# Patient Record
Sex: Female | Born: 1980 | Race: Black or African American | Hispanic: No | Smoking: Never smoker
Health system: Southern US, Community
[De-identification: ages and names within clinical notes are randomized; demographics above are authoritative.]

## PROBLEM LIST (undated history)

## (undated) ENCOUNTER — Inpatient Hospital Stay (HOSPITAL_COMMUNITY): Payer: Self-pay

## (undated) DIAGNOSIS — R42 Dizziness and giddiness: Secondary | ICD-10-CM

## (undated) DIAGNOSIS — Z789 Other specified health status: Secondary | ICD-10-CM

## (undated) HISTORY — PX: TONSILLECTOMY: SUR1361

## (undated) HISTORY — PX: WISDOM TOOTH EXTRACTION: SHX21

---

## 2011-06-19 ENCOUNTER — Emergency Department (INDEPENDENT_AMBULATORY_CARE_PROVIDER_SITE_OTHER)

## 2011-06-19 ENCOUNTER — Emergency Department (INDEPENDENT_AMBULATORY_CARE_PROVIDER_SITE_OTHER)
Admission: EM | Admit: 2011-06-19 | Discharge: 2011-06-19 | Disposition: A | Discharge status: Home / Self Care | Source: Home / Self Care | Attending: Family Medicine | Admitting: Family Medicine

## 2011-06-19 ENCOUNTER — Encounter (HOSPITAL_COMMUNITY): Payer: Self-pay | Admitting: Emergency Medicine

## 2011-06-19 ENCOUNTER — Ambulatory Visit: Payer: Self-pay

## 2011-06-19 DIAGNOSIS — S93409A Sprain of unspecified ligament of unspecified ankle, initial encounter: Secondary | ICD-10-CM

## 2011-06-19 DIAGNOSIS — IMO0002 Reserved for concepts with insufficient information to code with codable children: Secondary | ICD-10-CM

## 2011-06-19 MED ORDER — HYDROCODONE-ACETAMINOPHEN 5-325 MG PO TABS: 1.0000 | ORAL_TABLET | Freq: Four times a day (QID) | ORAL | Status: AC | PRN

## 2011-06-19 NOTE — ED Notes (Signed)
Pt here with left medial ankle pain and swelling after injury today.states she missed step and ankle twisted and popped.swelling seen and limping.ice and ibuprofen taken

## 2011-06-19 NOTE — ED Provider Notes (Signed)
History     CSN: [id number]  Arrival date & time 06/19/11  1340   First MD Initiated Contact with Patient 06/19/11 1342      Chief Complaint  Patient presents with  . Ankle Injury    (Consider location/radiation/quality/duration/timing/severity/associated sxs/prior treatment) Patient is a 31 y.o. female presenting with ankle pain. The history is provided by the patient.  Ankle Pain  The incident occurred 3 to 5 hours ago. The incident occurred at work. Injury mechanism: missed last step coming down and rolled foot/ankle on left with pop heard, pain with walking. The pain is present in the left ankle. The pain is mild. Associated symptoms include inability to bear weight. Pertinent negatives include no numbness and no tingling.    History reviewed. No pertinent past medical history.  Past Surgical History  Procedure Date  . Tonsillectomy     No family history on file.  History  Substance Use Topics  . Smoking status: Never Smoker   . Smokeless tobacco: Not on file  . Alcohol Use: Yes    OB History    Grav Para Term Preterm Abortions TAB SAB Ect Mult Living                  Review of Systems  Constitutional: Negative.   Musculoskeletal: Positive for joint swelling and gait problem.  Neurological: Negative for tingling and numbness.    Allergies  Review of patient's allergies indicates no known allergies.  Home Medications   Current Outpatient Rx  Name Route Sig Dispense Refill  . HYDROCODONE-ACETAMINOPHEN 5-325 MG PO TABS Oral Take 1 tablet by mouth every 6 (six) hours as needed for pain. 10 tablet 1    BP 134/67  Pulse 70  Temp 98.3 F (36.8 C) (Oral)  Resp 14  SpO2 99%  LMP 05/19/2011  Physical Exam  Nursing note and vitals reviewed. Constitutional: She is oriented to person, place, and time. She appears well-developed and well-nourished.  Musculoskeletal: She exhibits tenderness.       Left ankle: She exhibits swelling. She exhibits no deformity,  no laceration and normal pulse. tenderness. AITFL tenderness found. No lateral malleolus, no medial malleolus, no CF ligament, no posterior TFL, no head of 5th metatarsal and no proximal fibula tenderness found. Achilles tendon normal.  Neurological: She is alert and oriented to person, place, and time.  Skin: Skin is warm and dry.    ED Course  Procedures (including critical care time)  Labs Reviewed - No data to display Dg Ankle Complete Left  06/19/2011  *RADIOLOGY REPORT*  Clinical Data: Twisting injury of ankle.  LEFT ANKLE COMPLETE - 3+ VIEW  Comparison: None.  Findings: There are small avulsion fractures from the dorsal aspect of the navicular, best seen on the lateral view. There are possible additional small avulsion fractures laterally on the AP view, possibly from the calcaneus or cuboid.  There is mild lateral soft tissue swelling.  The distal tibia and fibula appear intact.  IMPRESSION: Small avulsion fractures as described with lateral midfoot soft tissue swelling.  Original Report Authenticated By: Gerrianne Scale, M.D.     1. Sprain of left ankle or foot       MDM  X-rays reviewed and report per radiologist.         Linna Hoff, MD 06/19/11 1500

## 2011-06-19 NOTE — Discharge Instructions (Signed)
Wear ankle support as needed for comfort, activity as tolerated. advil or pain medicine as needed, ice and crutches and elevate for pain and swelling,return or see orthopedist if further problems.

## 2013-01-05 NOTE — L&D Delivery Note (Signed)
Delivery Note At 9:21 AM a viable and healthy female was delivered via Vaginal, Spontaneous Delivery (Presentation: Right Occiput Anterior).  APGAR: 9, 9; weight  pending.   Placenta status: Intact, Spontaneous.  True knot x 1 Cord: 3 vessels with the following complications: Knot.  Cord pH: none  Anesthesia: Epidural Local  Episiotomy: None Lacerations: right  Labial minora; left Periurethral Suture Repair: 3.0 chromic Est. Blood Loss (mL): 350  Mom to postpartum.  Baby to Couplet care / Skin to Skin.  Sebron Mcmahill A 10/29/2013, 10:06 AM

## 2013-04-14 LAB — OB RESULTS CONSOLE RUBELLA ANTIBODY, IGM
Rubella: IMMUNE
Rubella: NON-IMMUNE/NOT IMMUNE

## 2013-04-14 LAB — OB RESULTS CONSOLE HIV ANTIBODY (ROUTINE TESTING): HIV: NONREACTIVE

## 2013-04-14 LAB — OB RESULTS CONSOLE ABO/RH: RH Type: POSITIVE

## 2013-04-14 LAB — OB RESULTS CONSOLE HEPATITIS B SURFACE ANTIGEN: Hepatitis B Surface Ag: NEGATIVE

## 2013-04-14 LAB — OB RESULTS CONSOLE RPR: RPR: NONREACTIVE

## 2013-04-24 LAB — OB RESULTS CONSOLE GC/CHLAMYDIA
CHLAMYDIA, DNA PROBE: NEGATIVE
GC PROBE AMP, GENITAL: NEGATIVE

## 2013-10-13 LAB — OB RESULTS CONSOLE GBS: STREP GROUP B AG: POSITIVE

## 2013-10-28 ENCOUNTER — Encounter (HOSPITAL_COMMUNITY): Payer: Self-pay | Admitting: *Deleted

## 2013-10-28 ENCOUNTER — Inpatient Hospital Stay (HOSPITAL_COMMUNITY)
Admission: AD | Admit: 2013-10-28 | Discharge: 2013-10-31 | DRG: 775 | Disposition: A | Discharge status: Home / Self Care | Source: Ambulatory Visit | Attending: Obstetrics and Gynecology | Admitting: Obstetrics and Gynecology

## 2013-10-28 ENCOUNTER — Inpatient Hospital Stay (HOSPITAL_COMMUNITY): Admitting: Anesthesiology

## 2013-10-28 ENCOUNTER — Encounter (HOSPITAL_COMMUNITY): Admitting: Anesthesiology

## 2013-10-28 DIAGNOSIS — O9902 Anemia complicating childbirth: Secondary | ICD-10-CM | POA: Diagnosis present

## 2013-10-28 DIAGNOSIS — Z833 Family history of diabetes mellitus: Secondary | ICD-10-CM | POA: Diagnosis not present

## 2013-10-28 DIAGNOSIS — Z3A38 38 weeks gestation of pregnancy: Secondary | ICD-10-CM | POA: Diagnosis present

## 2013-10-28 DIAGNOSIS — D62 Acute posthemorrhagic anemia: Secondary | ICD-10-CM | POA: Diagnosis not present

## 2013-10-28 DIAGNOSIS — O99824 Streptococcus B carrier state complicating childbirth: Secondary | ICD-10-CM | POA: Diagnosis present

## 2013-10-28 DIAGNOSIS — D509 Iron deficiency anemia, unspecified: Secondary | ICD-10-CM | POA: Diagnosis present

## 2013-10-28 DIAGNOSIS — O9989 Other specified diseases and conditions complicating pregnancy, childbirth and the puerperium: Secondary | ICD-10-CM | POA: Diagnosis present

## 2013-10-28 HISTORY — DX: Other specified health status: Z78.9

## 2013-10-28 LAB — CBC
HEMATOCRIT: 31.9 % — AB (ref 36.0–46.0)
HEMOGLOBIN: 10.7 g/dL — AB (ref 12.0–15.0)
MCH: 26.8 pg (ref 26.0–34.0)
MCHC: 33.5 g/dL (ref 30.0–36.0)
MCV: 79.9 fL (ref 78.0–100.0)
Platelets: 171 10*3/uL (ref 150–400)
RBC: 3.99 MIL/uL (ref 3.87–5.11)
RDW: 12.9 % (ref 11.5–15.5)
WBC: 7.1 10*3/uL (ref 4.0–10.5)

## 2013-10-28 LAB — TYPE AND SCREEN
ABO/RH(D): O POS
Antibody Screen: NEGATIVE

## 2013-10-28 LAB — ABO/RH: ABO/RH(D): O POS

## 2013-10-28 LAB — RPR

## 2013-10-28 MED ORDER — TERBUTALINE SULFATE 1 MG/ML IJ SOLN: 0.2500 mg | Freq: Once | INTRAMUSCULAR | Status: AC | PRN

## 2013-10-28 MED ORDER — PHENYLEPHRINE 40 MCG/ML (10ML) SYRINGE FOR IV PUSH (FOR BLOOD PRESSURE SUPPORT)
80.0000 ug | PREFILLED_SYRINGE | INTRAVENOUS | Status: DC | PRN
  Filled 2013-10-28: qty 2

## 2013-10-28 MED ORDER — MISOPROSTOL 50MCG HALF TABLET
50.0000 ug | ORAL_TABLET | Freq: Four times a day (QID) | ORAL | Status: DC
  Administered 2013-10-28 (×2): 50 ug via ORAL
  Filled 2013-10-28 (×6): qty 1

## 2013-10-28 MED ORDER — FLEET ENEMA 7-19 GM/118ML RE ENEM: 1.0000 | ENEMA | Freq: Every day | RECTAL | Status: DC | PRN

## 2013-10-28 MED ORDER — OXYCODONE-ACETAMINOPHEN 5-325 MG PO TABS: 2.0000 | ORAL_TABLET | ORAL | Status: DC | PRN

## 2013-10-28 MED ORDER — NALBUPHINE HCL 10 MG/ML IJ SOLN
10.0000 mg | INTRAMUSCULAR | Status: DC | PRN
Start: 2013-10-28 — End: 2013-10-29
  Administered 2013-10-28: 10 mg via INTRAMUSCULAR
  Filled 2013-10-28: qty 1

## 2013-10-28 MED ORDER — OXYTOCIN 40 UNITS IN LACTATED RINGERS INFUSION - SIMPLE MED: 62.5000 mL/h | INTRAVENOUS | Status: DC

## 2013-10-28 MED ORDER — ONDANSETRON HCL 4 MG/2ML IJ SOLN: 4.0000 mg | Freq: Four times a day (QID) | INTRAMUSCULAR | Status: DC | PRN

## 2013-10-28 MED ORDER — PENICILLIN G POTASSIUM 5000000 UNITS IJ SOLR
2.5000 10*6.[IU] | INTRAVENOUS | Status: DC
  Administered 2013-10-28 – 2013-10-29 (×6): 2.5 10*6.[IU] via INTRAVENOUS
  Filled 2013-10-28 (×9): qty 2.5

## 2013-10-28 MED ORDER — OXYCODONE-ACETAMINOPHEN 5-325 MG PO TABS: 1.0000 | ORAL_TABLET | ORAL | Status: DC | PRN

## 2013-10-28 MED ORDER — DIPHENHYDRAMINE HCL 50 MG/ML IJ SOLN: 12.5000 mg | INTRAMUSCULAR | Status: DC | PRN

## 2013-10-28 MED ORDER — FENTANYL 2.5 MCG/ML BUPIVACAINE 1/10 % EPIDURAL INFUSION (WH - ANES)
14.0000 mL/h | INTRAMUSCULAR | Status: DC | PRN
  Administered 2013-10-28 – 2013-10-29 (×2): 14 mL/h via EPIDURAL
  Filled 2013-10-28 (×2): qty 125

## 2013-10-28 MED ORDER — LACTATED RINGERS IV SOLN
INTRAVENOUS | Status: DC
  Administered 2013-10-28 (×2): via INTRAVENOUS

## 2013-10-28 MED ORDER — OXYTOCIN BOLUS FROM INFUSION: 500.0000 mL | INTRAVENOUS | Status: DC

## 2013-10-28 MED ORDER — NALBUPHINE HCL 10 MG/ML IJ SOLN
10.0000 mg | INTRAMUSCULAR | Status: DC | PRN
  Administered 2013-10-28 (×2): 10 mg via INTRAVENOUS
  Filled 2013-10-28 (×2): qty 1

## 2013-10-28 MED ORDER — PENICILLIN G POTASSIUM 5000000 UNITS IJ SOLR
5.0000 10*6.[IU] | Freq: Once | INTRAVENOUS | Status: AC
  Administered 2013-10-28: 5 10*6.[IU] via INTRAVENOUS
  Filled 2013-10-28: qty 5

## 2013-10-28 MED ORDER — LACTATED RINGERS IV SOLN
500.0000 mL | Freq: Once | INTRAVENOUS | Status: AC
Start: 2013-10-28 — End: 2013-10-28
  Administered 2013-10-28: 500 mL via INTRAVENOUS

## 2013-10-28 MED ORDER — ACETAMINOPHEN 325 MG PO TABS: 650.0000 mg | ORAL_TABLET | ORAL | Status: DC | PRN

## 2013-10-28 MED ORDER — LIDOCAINE HCL (PF) 1 % IJ SOLN
30.0000 mL | INTRAMUSCULAR | Status: AC | PRN
  Administered 2013-10-28 (×2): 5 mL via SUBCUTANEOUS

## 2013-10-28 MED ORDER — EPHEDRINE 5 MG/ML INJ
10.0000 mg | INTRAVENOUS | Status: DC | PRN
  Filled 2013-10-28: qty 2

## 2013-10-28 MED ORDER — PHENYLEPHRINE 40 MCG/ML (10ML) SYRINGE FOR IV PUSH (FOR BLOOD PRESSURE SUPPORT)
80.0000 ug | PREFILLED_SYRINGE | INTRAVENOUS | Status: DC | PRN
  Filled 2013-10-28: qty 2
  Filled 2013-10-28: qty 10

## 2013-10-28 MED ORDER — OXYTOCIN 40 UNITS IN LACTATED RINGERS INFUSION - SIMPLE MED
1.0000 m[IU]/min | INTRAVENOUS | Status: DC
  Administered 2013-10-28: 2 m[IU]/min via INTRAVENOUS
  Filled 2013-10-28: qty 1000

## 2013-10-28 MED ORDER — PROMETHAZINE HCL 25 MG/ML IJ SOLN
12.5000 mg | INTRAMUSCULAR | Status: DC | PRN
  Administered 2013-10-28: 12.5 mg via INTRAMUSCULAR
  Filled 2013-10-28: qty 1

## 2013-10-28 MED ORDER — EPHEDRINE 5 MG/ML INJ
10.0000 mg | INTRAVENOUS | Status: DC | PRN
  Filled 2013-10-28: qty 4
  Filled 2013-10-28: qty 2

## 2013-10-28 MED ORDER — LACTATED RINGERS IV SOLN
500.0000 mL | INTRAVENOUS | Status: DC | PRN
  Administered 2013-10-29: 600 mL via INTRAVENOUS

## 2013-10-28 MED ORDER — CITRIC ACID-SODIUM CITRATE 334-500 MG/5ML PO SOLN: 30.0000 mL | ORAL | Status: DC | PRN

## 2013-10-28 NOTE — H&P (Addendum)
Rachael Warren is a 33 y.o. female presenting for admission due to SROM @ 1:30 am clear fluid. Rare ctx. (+) GBS.   Maternal Medical History:  Reason for admission: Rupture of membranes.   Contractions: Frequency: rare.    Fetal activity: Perceived fetal activity is normal.    Prenatal complications: no prenatal complications   OB History   Grav Para Term Preterm Abortions TAB SAB Ect Mult Living   2 0 0 0 1 1    0     Past Medical History  Diagnosis Date  . Medical history non-contributory    Past Surgical History  Procedure Laterality Date  . Tonsillectomy    . Tonsillectomy  age 72   Family History: family history includes Diabetes in her paternal grandmother; Heart disease in her paternal grandfather. Social History:  reports that she has never smoked. She does not have any smokeless tobacco history on file. She reports that she drinks alcohol. She reports that she does not use illicit drugs.   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 rate 18, height _0  (1.651 m), weight 93.441 kg (206 lb). Maternal Exam:  Abdomen: Patient reports no abdominal tenderness. Estimated fetal weight is 7lb 11 oz.   Fetal presentation: vertex  Introitus: Normal vulva. Normal vagina.  Amniotic fluid character: clear.  Pelvis: adequate for delivery.   Cervix: Cervix evaluated by digital exam.     Physical Exam  Constitutional: She appears well-developed and  well-nourished.  HENT:  Head: Atraumatic.  Eyes: EOM are normal.  Neck: Neck supple.  Cardiovascular: Normal rate and regular rhythm.   Respiratory: Effort normal.  GI: Soft.  Musculoskeletal: She exhibits edema.  Skin: Skin is warm and dry.  Psychiatric: She has a normal mood and affect.    Prenatal labs: ABO, Rh: --/--/O POS (10/24 0545) Antibody: NEG (10/24 0545) Rubella: Nonimmune (04/10 0000) RPR: Nonreactive (04/10 0000)  HBsAg: Negative (04/10 0000)  HIV: Non-reactive (04/10 0000)  GBS: Positive (10/09 0000)  CBC    Component Value Date/Time   WBC 7.1 10/28/2013 0545   RBC 3.99 10/28/2013 0545   HGB 10.7* 10/28/2013 0545   HCT 31.9* 10/28/2013 0545   PLT 171 10/28/2013 0545   MCV 79.9 10/28/2013 0545   MCH 26.8 10/28/2013 0545   MCHC 33.5 10/28/2013 0545   RDW 12.9 10/28/2013 0545     Assessment/Plan: SROM IUP@ 38 2/7 weeks GBS cx (+) P) admit routine labs  Oral cytotec 50 mg po q 6 hrs as needed. IV PCN. Defer exam unless need to assess fetus or for pain mgmt analgesic prn   Jilliane Kazanjian A 10/28/2013, 8:26 AM    10/30/13:  Reviewed office records: pt is rubella Immune. Will not need MMR

## 2013-10-28 NOTE — Anesthesia Procedure Notes (Signed)
Epidural Patient location during procedure: OB Start time: 10/28/2013 10:23 PM End time: 10/28/2013 10:37 PM  Staffing Anesthesiologist: Glema Takaki, CHRIS Performed by: anesthesiologist   Preanesthetic Checklist Completed: patient identified, surgical consent, pre-op evaluation, timeout performed, IV checked, risks and benefits discussed and monitors and equipment checked  Epidural Patient position: sitting Prep: site prepped and draped and DuraPrep Patient monitoring: heart rate, cardiac monitor, continuous pulse ox and blood pressure Approach: midline Location: L3-L4 Injection technique: LOR saline  Needle:  Needle type: Tuohy  Needle gauge: 17 G Needle length: 9 cm Needle insertion depth: 7 cm Catheter type: closed end flexible Catheter size: 19 Gauge Catheter at skin depth: 14 cm Test dose: Other and negative  Assessment Events: blood not aspirated, injection not painful, no injection resistance, negative IV test and no paresthesia  Additional Notes H+P and labs checked, risks and benefits discussed with the patient, consent obtained, procedure tolerated well and without complications.  Reason for block:procedure for pain

## 2013-10-28 NOTE — MAU Note (Signed)
Leaking fld since 0130. Clear fld. No contractions.

## 2013-10-28 NOTE — Progress Notes (Signed)
S: notes more painful ctx cytotec #2 given @ 3:30 pm S/p IV nubain  O: tracing: baseline 145 (+)early decel Ctx q 2-3 mins VE 1.5/100/-2 post  IMP: SROM GBS cx (+) on PCN IUP @ 38 2/7 weeks P) cont present mgmt. Pain med.

## 2013-10-28 NOTE — Progress Notes (Signed)
S: s/p cytotec x1 Notes mod cramping  O: VE deferred Tracing baseline 145 Small accel Irritability  IMP: SROM x 12 hr  GBS cx (+) on IV PCN Term gestation P) cont cytotec

## 2013-10-28 NOTE — Anesthesia Preprocedure Evaluation (Signed)
Anesthesia Evaluation   Patient awake    Reviewed: Allergy & Precautions, H&P , NPO status , Patient's Chart, lab work & pertinent test results  History of Anesthesia Complications Negative for: history of anesthetic complications  Airway Mallampati: II TM Distance: >3 FB Neck ROM: Full    Dental  (+) Teeth Intact   Pulmonary neg pulmonary ROS,          Cardiovascular Rhythm:Regular     Neuro/Psych negative neurological ROS  negative psych ROS   GI/Hepatic negative GI ROS, Neg liver ROS,   Endo/Other  negative endocrine ROS  Renal/GU negative Renal ROS     Musculoskeletal   Abdominal   Peds  Hematology  (+) anemia ,   Anesthesia Other Findings   Reproductive/Obstetrics (+) Pregnancy                           Anesthesia Physical Anesthesia Plan  ASA: II  Anesthesia Plan: Epidural   Post-op Pain Management:    Induction:   Airway Management Planned:   Additional Equipment:   Intra-op Plan:   Post-operative Plan:   Informed Consent: I have reviewed the patients History and Physical, chart, labs and discussed the procedure including the risks, benefits and alternatives for the proposed anesthesia with the patient or authorized representative who has indicated his/her understanding and acceptance.     Plan Discussed with: Anesthesiologist  Anesthesia Plan Comments:         Anesthesia Quick Evaluation

## 2013-10-28 NOTE — Progress Notes (Signed)
Report called to CSX CorporationDana RN in Bs. Pt to BS from Triage for further evaluation of SROM and labor. Pad saturated with clear fld in Triage

## 2013-10-29 ENCOUNTER — Encounter (HOSPITAL_COMMUNITY): Payer: Self-pay | Admitting: Obstetrics and Gynecology

## 2013-10-29 MED ORDER — WITCH HAZEL-GLYCERIN EX PADS: 1.0000 "application " | MEDICATED_PAD | CUTANEOUS | Status: DC | PRN

## 2013-10-29 MED ORDER — ONDANSETRON HCL 4 MG/2ML IJ SOLN: 4.0000 mg | INTRAMUSCULAR | Status: DC | PRN

## 2013-10-29 MED ORDER — OXYCODONE-ACETAMINOPHEN 5-325 MG PO TABS: 2.0000 | ORAL_TABLET | ORAL | Status: DC | PRN

## 2013-10-29 MED ORDER — LIDOCAINE HCL (PF) 1 % IJ SOLN
INTRAMUSCULAR | Status: AC
  Filled 2013-10-29: qty 30

## 2013-10-29 MED ORDER — ONDANSETRON HCL 4 MG PO TABS: 4.0000 mg | ORAL_TABLET | ORAL | Status: DC | PRN

## 2013-10-29 MED ORDER — DIBUCAINE 1 % RE OINT: 1.0000 "application " | TOPICAL_OINTMENT | RECTAL | Status: DC | PRN

## 2013-10-29 MED ORDER — PRENATAL MULTIVITAMIN CH
1.0000 | ORAL_TABLET | Freq: Every day | ORAL | Status: DC
  Administered 2013-10-30 – 2013-10-31 (×2): 1 via ORAL
  Filled 2013-10-29 (×2): qty 1

## 2013-10-29 MED ORDER — ZOLPIDEM TARTRATE 5 MG PO TABS: 5.0000 mg | ORAL_TABLET | Freq: Every evening | ORAL | Status: DC | PRN

## 2013-10-29 MED ORDER — BENZOCAINE-MENTHOL 20-0.5 % EX AERO
1.0000 "application " | INHALATION_SPRAY | CUTANEOUS | Status: DC | PRN
  Administered 2013-10-31: 1 via TOPICAL
  Filled 2013-10-29: qty 56

## 2013-10-29 MED ORDER — OXYCODONE-ACETAMINOPHEN 5-325 MG PO TABS
1.0000 | ORAL_TABLET | ORAL | Status: DC | PRN
  Administered 2013-10-29 – 2013-10-31 (×4): 1 via ORAL
  Filled 2013-10-29 (×4): qty 1

## 2013-10-29 MED ORDER — DIPHENHYDRAMINE HCL 25 MG PO CAPS: 25.0000 mg | ORAL_CAPSULE | Freq: Four times a day (QID) | ORAL | Status: DC | PRN

## 2013-10-29 MED ORDER — INFLUENZA VAC SPLIT QUAD 0.5 ML IM SUSY
0.5000 mL | PREFILLED_SYRINGE | INTRAMUSCULAR | Status: AC
  Administered 2013-10-31: 0.5 mL via INTRAMUSCULAR
  Filled 2013-10-29: qty 0.5

## 2013-10-29 MED ORDER — IBUPROFEN 600 MG PO TABS
600.0000 mg | ORAL_TABLET | Freq: Four times a day (QID) | ORAL | Status: DC
  Administered 2013-10-29 – 2013-10-31 (×8): 600 mg via ORAL
  Filled 2013-10-29 (×8): qty 1

## 2013-10-29 MED ORDER — SENNOSIDES-DOCUSATE SODIUM 8.6-50 MG PO TABS
2.0000 | ORAL_TABLET | ORAL | Status: DC
  Administered 2013-10-30 (×2): 2 via ORAL
  Filled 2013-10-29 (×2): qty 2

## 2013-10-29 MED ORDER — FERROUS SULFATE 325 (65 FE) MG PO TABS
325.0000 mg | ORAL_TABLET | Freq: Two times a day (BID) | ORAL | Status: DC
  Administered 2013-10-29 – 2013-10-31 (×4): 325 mg via ORAL
  Filled 2013-10-29 (×4): qty 1

## 2013-10-29 MED ORDER — LIDOCAINE HCL (PF) 1 % IJ SOLN
30.0000 mL | INTRAMUSCULAR | Status: DC | PRN
  Administered 2013-10-29: 30 mL via SUBCUTANEOUS
  Filled 2013-10-29: qty 30

## 2013-10-29 MED ORDER — SIMETHICONE 80 MG PO CHEW: 80.0000 mg | CHEWABLE_TABLET | ORAL | Status: DC | PRN

## 2013-10-29 MED ORDER — LANOLIN HYDROUS EX OINT: TOPICAL_OINTMENT | CUTANEOUS | Status: DC | PRN

## 2013-10-29 NOTE — Anesthesia Postprocedure Evaluation (Deleted)
  Anesthesia Post-op Note  Patient: Rachael BjorkAnn L Parkerson  Procedure(s) Performed: * No procedures listed *  Patient Location: Mother/Baby  Anesthesia Type:Epidural  Level of Consciousness: awake and alert   Airway and Oxygen Therapy: Patient Spontanous Breathing  Post-op Pain: mild  Post-op Assessment: Post-op Vital signs reviewed, Patient's Cardiovascular Status Stable, Respiratory Function Stable, No signs of Nausea or vomiting, Pain level controlled, No headache, No residual numbness and No residual motor weakness  Post-op Vital Signs: Reviewed  Last Vitals:  Filed Vitals:   10/29/13 1650  BP: 107/68  Pulse: 84  Temp: 37 C  Resp: 18    Complications: No apparent anesthesia complications

## 2013-10-29 NOTE — Lactation Note (Signed)
This note was copied from the chart of Boy Ulysees Barnsnn Bacote. Lactation Consultation Note  Patient Name: Boy Ulysees Barnsnn [place] ZOXWR'UToday's Date: 10/29/2013 Reason for consult: Initial assessment of this mom and baby, now 10 hours postpartum.  Mom is a primipara who attended [facility] BF class prenatally.  She reports that baby latched easily after delivery and has had a few feedings, with initial LATCH score=7.  Per mom, she has been shown hand expression technique.  LC encouraged frequent STS and cue feedings.  Mom encouraged to feed baby 8-12 times/24 hours and with feeding cues. LC encouraged review of Baby and Me pp 9, 14 and 20-25 for STS and BF information. LC provided [employer] Resource brochure and reviewed [facility] services and list of community and web site resources.      Maternal Data Formula Feeding for Exclusion: No Has patient been taught Hand Expression?: Yes (attended class and states her nurse has shown her how to hand express) Does the patient have breastfeeding experience prior to this delivery?: No  Feeding Feeding Type: Breast Fed Length of feed: 7 min  LATCH Score/Interventions Latch: Too sleepy or reluctant, no latch achieved, no sucking elicited.  Audible Swallowing: None  Type of Nipple: Everted at rest and after stimulation  Comfort (Breast/Nipple): Soft / non-tender     Hold (Positioning): Assistance needed to correctly position infant at breast and maintain latch.  LATCH Score: 5  (initial LATCH score=7, per RN assessment)  Lactation Tools Discussed/Used    STS, cue feedings, hand expression  Consult Status Consult Status: Follow-up Date: 10/30/13 Follow-up type: In-patient    Warrick ParisianBryant, Zylpha Poynor [facility] 10/29/2013, 8:09 PM

## 2013-10-29 NOTE — Progress Notes (Signed)
S. Sleeping Epidural  O: T98.4  Pitocin 8 MIU  VE8/100/+1 per RN  Tracing. Baseline 125 (+) accel Ctx q 2-3 1/2 mins   IMP: Active labor GBS cx (+) on PCN w/o evidence of chorio Term gestation P) cont increase pitocin/ cont present mgmt

## 2013-10-29 NOTE — Progress Notes (Signed)
Thick anterior cervix.  Pt not feeling any pressure or urge to push.  Push attempted but cervix would not reduce

## 2013-10-29 NOTE — Progress Notes (Signed)
S: pushing   O:Pitocin 14 MIU  fully (+) 2 station Tracing: baseline 120's (+) early decels some variables Ctx q 2-  IMP: complete' GBS cx (+) IV PCN P) anticipate svd

## 2013-10-30 LAB — CBC
HCT: 27.8 % — ABNORMAL LOW (ref 36.0–46.0)
Hemoglobin: 9.2 g/dL — ABNORMAL LOW (ref 12.0–15.0)
MCH: 26.6 pg (ref 26.0–34.0)
MCHC: 33.1 g/dL (ref 30.0–36.0)
MCV: 80.3 fL (ref 78.0–100.0)
PLATELETS: 157 10*3/uL (ref 150–400)
RBC: 3.46 MIL/uL — AB (ref 3.87–5.11)
RDW: 13.1 % (ref 11.5–15.5)
WBC: 13.5 10*3/uL — AB (ref 4.0–10.5)

## 2013-10-30 NOTE — Plan of Care (Signed)
Problem: Discharge Progression Outcomes Goal: MMR given as ordered Outcome: Completed/Met Date Met:  10/30/13 Patient is Rubella immune.  Results placed in paper chart.

## 2013-10-30 NOTE — Anesthesia Postprocedure Evaluation (Signed)
  Anesthesia Post-op Note  Patient: Rachael Warren  Procedure(s) Performed: * No procedures listed *  Patient Location: Mother/Baby  Anesthesia Type:Epidural  Level of Consciousness: awake, alert , oriented and patient cooperative  Airway and Oxygen Therapy: Patient Spontanous Breathing  Post-op Pain: none  Post-op Assessment: Post-op Vital signs reviewed, Patient's Cardiovascular Status Stable, Respiratory Function Stable, Patent Airway, No signs of Nausea or vomiting, Adequate PO intake, Pain level controlled, No headache, No backache, No residual numbness and No residual motor weakness  Post-op Vital Signs: Reviewed and stable  Last Vitals:  Filed Vitals:   10/30/13 0600  BP: 118/64  Pulse: 70  Temp: 36.4 C  Resp: 18    Complications: No apparent anesthesia complications

## 2013-10-30 NOTE — Progress Notes (Signed)
PPD 1 SVD  S:  Reports feeling well - really tired and crampy             Tolerating po/ No nausea or vomiting             Bleeding is moderate             Pain controlled with motrin and percocet             Up ad lib / ambulatory / voiding QS  Newborn breast feeding  / Circumcision done this AM O:               VS: BP 118/64  Pulse 70  Temp(Src) 97.6 F (36.4 C) (Oral)  Resp 18  Ht 5\' 5"  (1.651 m)  Wt 93.441 kg (206 lb)  BMI 34.28 kg/m2  SpO2 100%  Breastfeeding? Unknown   LABS:              Recent Labs  10/28/13 0545 10/30/13 0625  WBC 7.1 13.5*  HGB 10.7* 9.2*  PLT 171 157               Blood type: --/--/O POS, O POS (10/24 0545)  Rubella: Nonimmune, Immune (04/10 0000)                     I&O: Intake/Output     10/25 0701 - 10/26 0700 10/26 0701 - 10/27 0700   Urine (mL/kg/hr) 1200 (0.5)    Blood 350 (0.2)    Total Output 1550     Net -1550          Urine Occurrence 1 x                  Physical Exam:             Alert and oriented X3  Abdomen: soft, non-tender, non-distended              Fundus: firm, non-tender, U-1  Perineum: ice pack in place  Lochia: light  Extremities: no edema, no calf pain or tenderness    A: PPD # 1   Doing well - stable status  P: Routine post partum orders  DC tomorrow  Marlinda MikeBAILEY, TANYA CNM, MSN, [facility] 10/30/2013, 10:25 AM

## 2013-10-31 DIAGNOSIS — D509 Iron deficiency anemia, unspecified: Secondary | ICD-10-CM | POA: Diagnosis present

## 2013-10-31 DIAGNOSIS — D62 Acute posthemorrhagic anemia: Secondary | ICD-10-CM | POA: Diagnosis not present

## 2013-10-31 MED ORDER — MAGNESIUM OXIDE 400 (241.3 MG) MG PO TABS
200.0000 mg | ORAL_TABLET | Freq: Every day | ORAL | Status: DC
  Filled 2013-10-31: qty 0.5

## 2013-10-31 MED ORDER — IBUPROFEN 600 MG PO TABS: 600.0000 mg | ORAL_TABLET | Freq: Four times a day (QID) | ORAL | Status: DC

## 2013-10-31 MED ORDER — FERROUS SULFATE 325 (65 FE) MG PO TABS: 325.0000 mg | ORAL_TABLET | Freq: Two times a day (BID) | ORAL | Status: DC

## 2013-10-31 MED ORDER — MAGNESIUM OXIDE 400 (241.3 MG) MG PO TABS: 200.0000 mg | ORAL_TABLET | Freq: Every day | ORAL | Status: DC

## 2013-10-31 MED ORDER — OXYCODONE-ACETAMINOPHEN 5-325 MG PO TABS: 1.0000 | ORAL_TABLET | ORAL | Status: DC | PRN

## 2013-10-31 NOTE — Discharge Summary (Signed)
Obstetric Discharge Summary Reason for Admission: onset of labor and rupture of membranes Prenatal Procedures: ultrasound Intrapartum Procedures: spontaneous vaginal delivery and GBS prophylaxis Postpartum Procedures: none Complications-Operative and Postpartum: right labial and periurethral laceration Hemoglobin  Date Value Ref Range Status  10/30/2013 9.2* 12.0 - 15.0 g/dL Final     HCT  Date Value Ref Range Status  10/30/2013 27.8* 36.0 - 46.0 % Final    Physical Exam:  General: alert and cooperative Lochia: appropriate Uterine Fundus: firm Incision: healing well, no significant drainage, no dehiscence, no significant erythema DVT Evaluation: No evidence of DVT seen on physical exam. Negative Homan's sign. No cords or calf tenderness. No significant calf/ankle edema.  Discharge Diagnoses: Term Pregnancy-delivered  Discharge Information: Date: 10/31/2013 Activity: pelvic rest Diet: routine Medications: PNV, Ibuprofen, Iron and Percocet Condition: stable Instructions: refer to practice specific booklet Discharge to: home Follow-up Information   Follow up with COUSINS,SHERONETTE A, MD. Schedule an appointment as soon as possible for a visit in 6 weeks.   Specialty:  Obstetrics and Gynecology   Contact information:   [address] [place] [id number] [phone]       Newborn Data: Live born female on 10/29/13 Birth Weight: 7 lb 7.9 oz (3400 g) APGAR: 9, 9  Home with mother.  Rachael Warren, N 10/31/2013, 12:00 PM

## 2013-10-31 NOTE — Progress Notes (Addendum)
PPD #2- SVD  Subjective:   Reports feeling well Tolerating po/ No nausea or vomiting Bleeding is light Pain controlled with Motrin and Percocet Up ad lib / ambulatory / voiding without problems Newborn: breastfeeding  / Circumcision: done   Objective:   VS: VS:  Filed Vitals:   10/30/13 0231 10/30/13 0600 10/30/13 1730 10/31/13 0555  BP: 111/52 118/64 121/67 119/62  Pulse: 68 70 79 81  Temp:  97.6 F (36.4 C) 97.7 F (36.5 C) 98.1 F (36.7 C)  TempSrc:   Oral Oral  Resp: 18 18 18 18   Height:      Weight:      SpO2: 100%       LABS:  Recent Labs  10/30/13 0625  WBC 13.5*  HGB 9.2*  PLT 157   Blood type: --/--/O POS, O POS (10/24 0545) Rubella: IMMUNE               I&O: Intake/Output     10/26 0701 - 10/27 0700 10/27 0701 - 10/28 0700   Urine (mL/kg/hr)     Blood     Total Output       Net              Physical Exam: Alert and oriented X3 Abdomen: soft, non-tender, non-distended  Fundus: firm, non-tender, U-2 Perineum: Well approximated, no significant erythema, edema, or drainage; healing well. Lochia: small Extremities: No edema, no calf pain or tenderness    Assessment: PPD #2  G2P1011/ S/P:spontaneous vaginal, rt labial laceration and periurethral laceration IDA with compounding ABL anemia Doing well - stable for discharge home   Plan: Discharge home RX's:  Ibuprofen 600mg  po Q 6 hrs prn pain #30 Refill x 0 Niferex 150mg  po QD #30 Refill x 1 Percocet 5/325 1 to 2 po Q 4 hrs prn pain #30 Refill x 0 Routine pp visit in [employer] booklet given    Donette LarryBHAMBRI, Missael Ferrari, N MSN, CNM 10/31/2013, 9:49 AM

## 2013-11-06 ENCOUNTER — Encounter (HOSPITAL_COMMUNITY): Payer: Self-pay | Admitting: Obstetrics and Gynecology

## 2016-01-06 NOTE — L&D Delivery Note (Signed)
Operative Delivery Note At 11:28 PM a viable and healthy female was delivered via Vaginal, Vacuum Investment banker, operational(Extractor).  Presentation: vertex; Position: Occiput,, Anterior; Station: +3.  Verbal consent: obtained from patient.  Risks and benefits discussed in detail.  Risks include, but are not limited to the risks of anesthesia, bleeding, infection, damage to maternal tissues, fetal cephalhematoma.  There is also the risk of inability to effect vaginal delivery of the head, or shoulder dystocia that cannot be resolved by established maneuvers, leading to the need for emergency cesarean section. Indication: severe deep variables with ctx APGAR:9 , 9; weight  7lb 14 oz.   Placenta  Status: spontaneous intact sent to path: , .   Cord:  Cord around hands:  with the following complications: .  Cord pH: none  Anesthesia:  epidural Instruments: mushroom vacuum Episiotomy: None Lacerations: Labial minora ( left) Suture Repair: 3.0 chromic Est. Blood Loss (mL): 250  Mom to postpartum.  Baby to Couplet care / Skin to Skin.  [name] A [name] 11/20/2016, 1:04 AM

## 2016-04-14 LAB — OB RESULTS CONSOLE RUBELLA ANTIBODY, IGM: Rubella: IMMUNE

## 2016-04-14 LAB — OB RESULTS CONSOLE ABO/RH: RH TYPE: POSITIVE

## 2016-04-14 LAB — OB RESULTS CONSOLE HEPATITIS B SURFACE ANTIGEN: Hepatitis B Surface Ag: NEGATIVE

## 2016-04-14 LAB — OB RESULTS CONSOLE GC/CHLAMYDIA
Chlamydia: NEGATIVE
GC PROBE AMP, GENITAL: NEGATIVE

## 2016-04-14 LAB — OB RESULTS CONSOLE RPR: RPR: NONREACTIVE

## 2016-04-14 LAB — OB RESULTS CONSOLE HIV ANTIBODY (ROUTINE TESTING): HIV: NONREACTIVE

## 2016-04-14 LAB — OB RESULTS CONSOLE ANTIBODY SCREEN: Antibody Screen: NEGATIVE

## 2016-10-16 LAB — OB RESULTS CONSOLE GBS: STREP GROUP B AG: POSITIVE

## 2016-11-09 ENCOUNTER — Encounter (HOSPITAL_COMMUNITY): Payer: Self-pay | Admitting: *Deleted

## 2016-11-09 ENCOUNTER — Telehealth (HOSPITAL_COMMUNITY): Payer: Self-pay | Admitting: *Deleted

## 2016-11-09 NOTE — Telephone Encounter (Signed)
Preadmission screen  

## 2016-11-10 ENCOUNTER — Encounter (HOSPITAL_COMMUNITY): Payer: Self-pay | Admitting: *Deleted

## 2016-11-10 ENCOUNTER — Telehealth (HOSPITAL_COMMUNITY): Payer: Self-pay | Admitting: *Deleted

## 2016-11-10 NOTE — Telephone Encounter (Signed)
Preadmission screen  

## 2016-11-12 ENCOUNTER — Observation Stay (HOSPITAL_COMMUNITY)
Admission: RE | Admit: 2016-11-12 | Discharge: 2016-11-12 | Disposition: A | Discharge status: Home / Self Care | Source: Ambulatory Visit | Attending: Obstetrics and Gynecology | Admitting: Obstetrics and Gynecology

## 2016-11-12 DIAGNOSIS — Z3A39 39 weeks gestation of pregnancy: Secondary | ICD-10-CM | POA: Diagnosis not present

## 2016-11-12 DIAGNOSIS — O321XX0 Maternal care for breech presentation, not applicable or unspecified: Secondary | ICD-10-CM | POA: Diagnosis not present

## 2016-11-12 MED ORDER — LACTATED RINGERS IV BOLUS (SEPSIS): 1000.0000 mL | Freq: Once | INTRAVENOUS | Status: DC

## 2016-11-12 MED ORDER — TERBUTALINE SULFATE 1 MG/ML IJ SOLN
0.2500 mg | Freq: Once | INTRAMUSCULAR | Status: AC
  Administered 2016-11-12: 0.25 mg via SUBCUTANEOUS

## 2016-11-12 MED ORDER — TERBUTALINE SULFATE 1 MG/ML IJ SOLN
INTRAMUSCULAR | Status: AC
  Filled 2016-11-12: qty 1

## 2016-11-12 NOTE — H&P (Signed)
Rachael Warren is a 36 y.o. G4P1021 at 5389w0d by LMP  Presents for ECV due to breech presentation  Subjective: here for ECV   Objective: Ht 5\' 5"  (1.651 m)   Wt 99.8 kg (220 lb)   BMI 36.61 kg/m  No intake/output data recorded. No intake/output data recorded.  FHT:  FHR: 150  bpm, variability: moderate,  accelerations:  Present,  decelerations:  Absent UC:   none SVE:    flared/closed/-4 station Abdomen: gravid. Head maternal right Labs: Lab Results  Component Value Date   WBC 13.5 (H) 10/30/2013   HGB 9.2 (L) 10/30/2013   HCT 27.8 (L) 10/30/2013   MCV 80.3 10/30/2013   PLT 157 10/30/2013    Assessment / Plan: Complete breech presentation IUP @ 39 weeks P) External cephalic version Rachael Warren A 11/12/2016, 8:39 AM

## 2016-11-12 NOTE — Discharge Instructions (Signed)
Breech Birth °What is a breech birth? °A breech birth is when a baby is born with the buttocks or the feet first. Most babies are in a head down (vertex) position when they are born. There are three types of breech babies: °When the baby’s buttocks are showing first in the birth canal (vagina) with the legs straight up and the feet at the baby’s head (frank breech). °When the baby’s buttocks shows first with the legs bent at the knees and the feet down near the buttocks (complete breech). °When one or both of the baby’s feet are down below the buttocks (footling breech). ° °What are the risks of a breech birth? °Having a breech birth increases the risk to your baby. A breech birth may cause the following: °Umbilical cord prolapse. This is when the umbilical cord is in front of the baby before or during labor. This can cause the cord to become pinched or compressed. This can reduce the flow of blood and oxygen to the baby. °The baby getting stuck in the birth canal, which can cause injury or, rarely, death. °Injury to the nerves in the shoulder, arm, and hand (brachial plexus injury) when delivered. °Your baby being born too early (prematurely). °An increased need for a cesarean delivery. ° °What increases the risk of having a breech baby? °It is not known what causes your baby to be breech. However, risk factors that may increase your chances of having a breech baby include the following: °The mother having had several babies already. °The mother having twins or more. °The mother having a baby with certain congenital disabilities. °The mother going into labor early. °The mother having problems with her uterus, such as a tumor. °The mother having placenta problems (placenta previa) or too much or not enough fluid surrounding the baby (amniotic fluid). ° °How do I know if my baby is breech? °There are no symptoms for you to know that your baby is breech. When you are close to your due date, your health care provider  can tell if your baby is breech by: °An abdominal or vaginal (pelvic) exam. °An ultrasound. ° °Your health care provider may also be able to tell that your baby is breech if your baby’s heartbeat is heard above your belly button. °What can be done if my baby is breech? ° °Your health care provider may try to turn the baby in your uterus. This is a procedure called external cephalic version (ECV). This is done by your health care provider. He or she will place both hands on your abdomen and gently and slowly turn the baby around. It is important to know that ECV can increase your chances of suddenly going into labor. °If an ECV is done, it is done toward the end of a healthy pregnancy. The baby may remain in this position or he or she may turn back to the breech position. You and your health care provider will discuss if an ECV is recommended for you and your baby. °How will I delivery my baby if my baby is breech? °You and your health care provider will discuss the best way to deliver your baby. If your baby is breech, it is less likely that a vaginal delivery will be recommended due to the risks. Some breech babies may be delivered safely without a cesarean, while in other cases health care providers will recommend a cesarean delivery. °This information is not intended to replace advice given to you by your health care provider. Make   sure you discuss any questions you have with your health care provider. °Document Released: 02/12/2006 Document Revised: 12/09/2015 Document Reviewed: 10/26/2013 °Elsevier Interactive Patient Education © 2017 [employer] °Fetal Movement Counts °Patient Name: ________________________________________________ Patient Due Date: ____________________ °What is a fetal movement count? °A fetal movement count is the number of times that you feel your baby move during a certain amount of time. This may also be called a fetal kick count. A fetal movement count is recommended for every pregnant  woman. You may be asked to start counting fetal movements as early as week 28 of your pregnancy. °Pay attention to when your baby is most active. You may notice your baby's sleep and wake cycles. You may also notice things that make your baby move more. You should do a fetal movement count: °· When your baby is normally most active. °· At the same time each day. ° °A good time to count movements is while you are resting, after having something to eat and drink. °How do I count fetal movements? °1. Find a quiet, comfortable area. Sit, or lie down on your side. °2. Write down the date, the start time and stop time, and the number of movements that you felt between those two times. Take this information with you to your health care visits. °3. For 2 hours, count kicks, flutters, swishes, rolls, and jabs. You should feel at least 10 movements during 2 hours. °4. You may stop counting after you have felt 10 movements. °5. If you do not feel 10 movements in 2 hours, have something to eat and drink. Then, keep resting and counting for 1 hour. If you feel at least 4 movements during that hour, you may stop counting. °Contact a health care provider if: °· You feel fewer than 4 movements in 2 hours. °· Your baby is not moving like he or she usually does. °Date: ____________ Start time: ____________ Stop time: ____________ Movements: ____________ °Date: ____________ Start time: ____________ Stop time: ____________ Movements: ____________ °Date: ____________ Start time: ____________ Stop time: ____________ Movements: ____________ °Date: ____________ Start time: ____________ Stop time: ____________ Movements: ____________ °Date: ____________ Start time: ____________ Stop time: ____________ Movements: ____________ °Date: ____________ Start time: ____________ Stop time: ____________ Movements: ____________ °Date: ____________ Start time: ____________ Stop time: ____________ Movements: ____________ °Date: ____________ Start time:  ____________ Stop time: ____________ Movements: ____________ °Date: ____________ Start time: ____________ Stop time: ____________ Movements: ____________ °This information is not intended to replace advice given to you by your health care provider. Make sure you discuss any questions you have with your health care provider. °Document Released: 01/21/2006 Document Revised: 08/21/2015 Document Reviewed: 01/31/2015 °Elsevier Interactive Patient Education © 2018 [employer] Inc[employer] ° °

## 2016-11-13 NOTE — Procedures (Signed)
Consent signed. Bedside sono confirmed breech with vtx mat right.  Terbutaline given. Attempted version x 2 with head moving to 7 oclock position then revert back to breech. (+) FHR noted but deceleration to 100's noted. Slow return to baseline with good variability. Recheck presentation. Now VTX. Pt was placed on continuous monitor. IV fluids given. Unfavorable cervix. If reassuring testing, then d/c home with abdominal binder

## 2016-11-17 ENCOUNTER — Telehealth (HOSPITAL_COMMUNITY): Payer: Self-pay | Admitting: *Deleted

## 2016-11-17 NOTE — Telephone Encounter (Signed)
Preadmission screen  

## 2016-11-18 ENCOUNTER — Other Ambulatory Visit: Payer: Self-pay | Admitting: Obstetrics and Gynecology

## 2016-11-19 ENCOUNTER — Encounter (HOSPITAL_COMMUNITY): Payer: Self-pay | Admitting: Anesthesiology

## 2016-11-19 ENCOUNTER — Encounter (HOSPITAL_COMMUNITY): Payer: Self-pay

## 2016-11-19 ENCOUNTER — Inpatient Hospital Stay (HOSPITAL_COMMUNITY): Admitting: Anesthesiology

## 2016-11-19 ENCOUNTER — Inpatient Hospital Stay (HOSPITAL_COMMUNITY)
Admission: RE | Admit: 2016-11-19 | Discharge: 2016-11-21 | DRG: 807 | Disposition: A | Discharge status: Home / Self Care | Source: Ambulatory Visit | Attending: Obstetrics and Gynecology | Admitting: Obstetrics and Gynecology

## 2016-11-19 DIAGNOSIS — Z3483 Encounter for supervision of other normal pregnancy, third trimester: Secondary | ICD-10-CM | POA: Diagnosis present

## 2016-11-19 DIAGNOSIS — O99824 Streptococcus B carrier state complicating childbirth: Secondary | ICD-10-CM | POA: Diagnosis present

## 2016-11-19 DIAGNOSIS — O9962 Diseases of the digestive system complicating childbirth: Secondary | ICD-10-CM | POA: Diagnosis present

## 2016-11-19 DIAGNOSIS — K219 Gastro-esophageal reflux disease without esophagitis: Secondary | ICD-10-CM | POA: Diagnosis present

## 2016-11-19 DIAGNOSIS — Z3A40 40 weeks gestation of pregnancy: Secondary | ICD-10-CM

## 2016-11-19 DIAGNOSIS — O99355 Diseases of the nervous system complicating the puerperium: Secondary | ICD-10-CM | POA: Diagnosis not present

## 2016-11-19 DIAGNOSIS — Z8759 Personal history of other complications of pregnancy, childbirth and the puerperium: Secondary | ICD-10-CM

## 2016-11-19 DIAGNOSIS — O9902 Anemia complicating childbirth: Secondary | ICD-10-CM | POA: Diagnosis present

## 2016-11-19 DIAGNOSIS — G5731 Lesion of lateral popliteal nerve, right lower limb: Secondary | ICD-10-CM | POA: Diagnosis not present

## 2016-11-19 DIAGNOSIS — Z3490 Encounter for supervision of normal pregnancy, unspecified, unspecified trimester: Secondary | ICD-10-CM | POA: Diagnosis present

## 2016-11-19 DIAGNOSIS — D649 Anemia, unspecified: Secondary | ICD-10-CM | POA: Diagnosis present

## 2016-11-19 LAB — RPR: RPR: NONREACTIVE

## 2016-11-19 LAB — CBC
HCT: 33.3 % — ABNORMAL LOW (ref 36.0–46.0)
Hemoglobin: 10.8 g/dL — ABNORMAL LOW (ref 12.0–15.0)
MCH: 26.7 pg (ref 26.0–34.0)
MCHC: 32.4 g/dL (ref 30.0–36.0)
MCV: 82.2 fL (ref 78.0–100.0)
PLATELETS: 185 10*3/uL (ref 150–400)
RBC: 4.05 MIL/uL (ref 3.87–5.11)
RDW: 12.9 % (ref 11.5–15.5)
WBC: 7.1 10*3/uL (ref 4.0–10.5)

## 2016-11-19 LAB — TYPE AND SCREEN
ABO/RH(D): O POS
Antibody Screen: NEGATIVE

## 2016-11-19 MED ORDER — LACTATED RINGERS IV SOLN
INTRAVENOUS | Status: DC
  Administered 2016-11-19 (×2): via INTRAVENOUS

## 2016-11-19 MED ORDER — FENTANYL 2.5 MCG/ML BUPIVACAINE 1/10 % EPIDURAL INFUSION (WH - ANES)
14.0000 mL/h | INTRAMUSCULAR | Status: DC | PRN
  Administered 2016-11-19 (×2): 14 mL/h via EPIDURAL
  Filled 2016-11-19 (×2): qty 100

## 2016-11-19 MED ORDER — LACTATED RINGERS IV SOLN
INTRAVENOUS | Status: DC
  Administered 2016-11-19: 19:00:00 via INTRAUTERINE

## 2016-11-19 MED ORDER — TERBUTALINE SULFATE 1 MG/ML IJ SOLN
0.2500 mg | Freq: Once | INTRAMUSCULAR | Status: DC | PRN
  Filled 2016-11-19: qty 1

## 2016-11-19 MED ORDER — FLEET ENEMA 7-19 GM/118ML RE ENEM: 1.0000 | ENEMA | RECTAL | Status: DC | PRN

## 2016-11-19 MED ORDER — LACTATED RINGERS IV SOLN: 500.0000 mL | Freq: Once | INTRAVENOUS | Status: DC

## 2016-11-19 MED ORDER — ACETAMINOPHEN 325 MG PO TABS: 650.0000 mg | ORAL_TABLET | ORAL | Status: DC | PRN

## 2016-11-19 MED ORDER — LIDOCAINE HCL (PF) 1 % IJ SOLN
30.0000 mL | INTRAMUSCULAR | Status: DC | PRN
  Administered 2016-11-19: 30 mL via SUBCUTANEOUS
  Filled 2016-11-19: qty 30

## 2016-11-19 MED ORDER — PHENYLEPHRINE 40 MCG/ML (10ML) SYRINGE FOR IV PUSH (FOR BLOOD PRESSURE SUPPORT)
80.0000 ug | PREFILLED_SYRINGE | INTRAVENOUS | Status: AC | PRN
  Administered 2016-11-19 (×3): 80 ug via INTRAVENOUS

## 2016-11-19 MED ORDER — PENICILLIN G POT IN DEXTROSE 60000 UNIT/ML IV SOLN
3.0000 10*6.[IU] | INTRAVENOUS | Status: DC
  Administered 2016-11-19 (×3): 3 10*6.[IU] via INTRAVENOUS
  Filled 2016-11-19 (×10): qty 50

## 2016-11-19 MED ORDER — PHENYLEPHRINE 40 MCG/ML (10ML) SYRINGE FOR IV PUSH (FOR BLOOD PRESSURE SUPPORT)
80.0000 ug | PREFILLED_SYRINGE | INTRAVENOUS | Status: DC | PRN
  Filled 2016-11-19: qty 10
  Filled 2016-11-19: qty 5
  Filled 2016-11-19: qty 10

## 2016-11-19 MED ORDER — SOD CITRATE-CITRIC ACID 500-334 MG/5ML PO SOLN: 30.0000 mL | ORAL | Status: DC | PRN

## 2016-11-19 MED ORDER — ONDANSETRON HCL 4 MG/2ML IJ SOLN: 4.0000 mg | Freq: Four times a day (QID) | INTRAMUSCULAR | Status: DC | PRN

## 2016-11-19 MED ORDER — BUPIVACAINE HCL (PF) 0.25 % IJ SOLN
INTRAMUSCULAR | Status: DC | PRN
  Administered 2016-11-19 (×2): 5 mL via EPIDURAL

## 2016-11-19 MED ORDER — DIPHENHYDRAMINE HCL 50 MG/ML IJ SOLN: 12.5000 mg | INTRAMUSCULAR | Status: DC | PRN

## 2016-11-19 MED ORDER — EPHEDRINE 5 MG/ML INJ
10.0000 mg | INTRAVENOUS | Status: DC | PRN
  Filled 2016-11-19: qty 2

## 2016-11-19 MED ORDER — OXYTOCIN 40 UNITS IN LACTATED RINGERS INFUSION - SIMPLE MED: 2.5000 [IU]/h | INTRAVENOUS | Status: DC

## 2016-11-19 MED ORDER — LACTATED RINGERS IV SOLN
500.0000 mL | INTRAVENOUS | Status: DC | PRN
  Administered 2016-11-19: 500 mL via INTRAVENOUS

## 2016-11-19 MED ORDER — PENICILLIN G POTASSIUM 5000000 UNITS IJ SOLR
5.0000 10*6.[IU] | Freq: Once | INTRAVENOUS | Status: AC
  Administered 2016-11-19: 5 10*6.[IU] via INTRAVENOUS
  Filled 2016-11-19: qty 5

## 2016-11-19 MED ORDER — OXYTOCIN 40 UNITS IN LACTATED RINGERS INFUSION - SIMPLE MED
1.0000 m[IU]/min | INTRAVENOUS | Status: DC
  Administered 2016-11-19: 2 m[IU]/min via INTRAVENOUS
  Filled 2016-11-19: qty 1000

## 2016-11-19 MED ORDER — LIDOCAINE HCL (PF) 1 % IJ SOLN
INTRAMUSCULAR | Status: DC | PRN
  Administered 2016-11-19 (×2): 4 mL via EPIDURAL

## 2016-11-19 MED ORDER — OXYTOCIN BOLUS FROM INFUSION
500.0000 mL | Freq: Once | INTRAVENOUS | Status: AC
  Administered 2016-11-19: 500 mL via INTRAVENOUS

## 2016-11-19 NOTE — H&P (Signed)
Rachael Warren is Warren 36 y.o. female presenting for IOL @ term . PNC notable for successful ECV last week, (+) GBS OB History    Gravida Para Term Preterm AB Living   4 1 1  0 2 1   SAB TAB Ectopic Multiple Live Births     2     1     Past Medical History:  Diagnosis Date  . GERD (gastroesophageal reflux disease)   . Medical history non-contributory   . Postpartum care following vaginal delivery (10/25) 10/29/2013   Past Surgical History:  Procedure Laterality Date  . TONSILLECTOMY    . TONSILLECTOMY  age 36   Family History: family history includes Brain cancer in her maternal aunt; Diabetes in her paternal grandmother; Heart attack in her paternal grandfather; Heart disease in her paternal grandfather. Social History:  reports that  has never smoked. she has never used smokeless tobacco. She reports that she drinks alcohol. She reports that she does not use drugs.     Maternal Diabetes: No Genetic Screening: Normal Maternal Ultrasounds/Referrals: Normal Fetal Ultrasounds or other Referrals:  None Maternal Substance Abuse:  No Significant Maternal Medications:  None Significant Maternal Lab Results:  Lab values include: Group B Strep positive Other Comments:  None  Review of Systems  All other systems reviewed and are negative.  Maternal Medical History:  Fetal activity: Perceived fetal activity is normal.    Prenatal Complications - Diabetes: none.       Physical Exam  Constitutional: She is oriented to person, place, and time. She appears well-developed and well-nourished.  HENT:  Head: Atraumatic.  Eyes: EOM are normal.  Neck: Neck supple.  Cardiovascular: Regular rhythm.  Respiratory: Breath sounds normal.  GI: Soft.  Musculoskeletal: She exhibits edema.  Neurological: She is alert and oriented to person, place, and time.  Skin: Skin is warm.  Psychiatric: She has Warren normal mood and affect.   VE 1/60/-3 Intracervical balloon placed  Prenatal labs: ABO,  Rh: --/--/O POS (11/15 [id number]) Antibody: NEG (11/15 0810) Rubella: Immune (04/10 0000) RPR: Non Reactive (11/15 0810)  HBsAg: Negative (04/10 0000)  HIV: Non-reactive (04/10 0000)  GBS: Positive (10/12 0000)   Assessment/Plan: Term gestation  (+) GBS cx P) admit routine labs. Pitocin induction. intracervical balloon placed . IV PCN. Analgesic prn  Rachael Warren 11/19/2016, 3:02 PM

## 2016-11-19 NOTE — Anesthesia Pain Management Evaluation Note (Signed)
  CRNA Pain Management Visit Note  Patient: Rachael Warren, 36 y.o., female  "Hello I am a [facility]. We have an anesthesia team available at all times to provide care throughout the hospital, including epidural management and anesthesia for C-section. I don't know your plan for the delivery whether it a natural birth, water birth, IV sedation, nitrous supplementation, doula or epidural, but we want to meet your pain goals."   1.Was your pain managed to your expectations on prior hospitalizations?   Yes   2.What is your expectation for pain management during this hospitalization?     Epidural  3.How can we help you reach that goal? epidural  Record the patient's initial score and the patient's pain goal.   Pain: 3  Pain Goal: 4 [facility] wants you to be able to say your pain was always managed very well.  Juliano Mceachin 11/19/2016

## 2016-11-19 NOTE — Anesthesia Preprocedure Evaluation (Signed)
Anesthesia Evaluation  Patient identified by MRN, date of birth, ID band Patient awake    Reviewed: Allergy & Precautions, H&P , NPO status , Patient's Chart, lab work & pertinent test results, reviewed documented beta blocker date and time   History of Anesthesia Complications Negative for: history of anesthetic complications  Airway Mallampati: II  TM Distance: >3 FB Neck ROM: full    Dental no notable dental hx. (+) Teeth Intact   Pulmonary neg pulmonary ROS,    Pulmonary exam normal breath sounds clear to auscultation       Cardiovascular Exercise Tolerance: Good negative cardio ROS Normal cardiovascular exam Rhythm:regular Rate:Normal     Neuro/Psych negative neurological ROS  negative psych ROS   GI/Hepatic Neg liver ROS, GERD  Medicated,  Endo/Other  negative endocrine ROS  Renal/GU negative Renal ROS     Musculoskeletal   Abdominal (+) + obese,   Peds  Hematology  (+) anemia ,   Anesthesia Other Findings   Reproductive/Obstetrics (+) Pregnancy                             Anesthesia Physical Anesthesia Plan  ASA: II  Anesthesia Plan: Epidural   Post-op Pain Management:    Induction:   PONV Risk Score and Plan:   Airway Management Planned:   Additional Equipment:   Intra-op Plan:   Post-operative Plan:   Informed Consent: I have reviewed the patients History and Physical, chart, labs and discussed the procedure including the risks, benefits and alternatives for the proposed anesthesia with the patient or authorized representative who has indicated his/her understanding and acceptance.     Plan Discussed with:   Anesthesia Plan Comments:         Anesthesia Quick Evaluation

## 2016-11-19 NOTE — Progress Notes (Signed)
S; notes more cramping when balloon was in  O: pitocin 20miu BP 116/63   Pulse 82   Temp 98.5 F (36.9 C) (Oral)   Resp 17   Ht 5\' 5"  (1.651 m)   Wt 98.9 kg (218 lb)   BMI 36.28 kg/m  VE loose 4/70/-3 applied AROM blood tinged IUPC, ISE placed  Tracing: baseline 140 (+) accel 155 Ctx ? q 3-4 mins  IMP: term Latent/active phase P) right exaggerated sims. Cont with pitocin. Analgesic prn

## 2016-11-19 NOTE — Anesthesia Procedure Notes (Signed)
Epidural Patient location during procedure: OB Start time: 11/19/2016 4:08 PM  Staffing Anesthesiologist: Leonides GrillsEllender, Ryan P, MD Performed: anesthesiologist   Preanesthetic Checklist Completed: patient identified, site marked, pre-op evaluation, timeout performed, IV checked, risks and benefits discussed and monitors and equipment checked  Epidural Patient position: sitting Prep: DuraPrep Patient monitoring: heart rate, cardiac monitor, continuous pulse ox and blood pressure Approach: midline Location: L4-L5 Injection technique: LOR air  Needle:  Needle type: Tuohy  Needle gauge: 17 G Needle length: 9 cm Needle insertion depth: 8 cm Catheter type: closed end flexible Catheter size: 19 Gauge Catheter at skin depth: 13 cm Test dose: negative and Other  Assessment Events: blood not aspirated, injection not painful, no injection resistance and negative IV test  Additional Notes Informed consent obtained prior to proceeding including risk of failure, 1% risk of PDPH, risk of minor discomfort and bruising. Discussed alternatives to epidural analgesia and patient desires to proceed.  Timeout performed pre-procedure verifying patient name, procedure, and platelet count.  Patient tolerated procedure well. Reason for block:procedure for pain

## 2016-11-19 NOTE — Progress Notes (Signed)
S; comfortable  O; BP (!) 121/54   Pulse 77   Temp 98.5 F (36.9 C) (Oral)   Resp 17   Ht 5\' 5"  (1.651 m)   Wt 98.9 kg (218 lb)   SpO2 100%   BMI 36.28 kg/m   Pitocin VE 6.5/80/-1 per RN  Tracing cat 1 Ctx q 2-3 mins  IMP: active phase GBS cx (+) on IV PCN Term gestation P) cont present mgmt

## 2016-11-20 ENCOUNTER — Encounter (HOSPITAL_COMMUNITY): Payer: Self-pay

## 2016-11-20 DIAGNOSIS — Z8759 Personal history of other complications of pregnancy, childbirth and the puerperium: Secondary | ICD-10-CM

## 2016-11-20 LAB — CBC
HEMATOCRIT: 32.2 % — AB (ref 36.0–46.0)
HEMOGLOBIN: 10.5 g/dL — AB (ref 12.0–15.0)
MCH: 27 pg (ref 26.0–34.0)
MCHC: 32.6 g/dL (ref 30.0–36.0)
MCV: 82.8 fL (ref 78.0–100.0)
Platelets: 178 10*3/uL (ref 150–400)
RBC: 3.89 MIL/uL (ref 3.87–5.11)
RDW: 13.1 % (ref 11.5–15.5)
WBC: 11.2 10*3/uL — ABNORMAL HIGH (ref 4.0–10.5)

## 2016-11-20 MED ORDER — COCONUT OIL OIL: 1.0000 "application " | TOPICAL_OIL | Status: DC | PRN

## 2016-11-20 MED ORDER — OXYCODONE HCL 5 MG PO TABS: 5.0000 mg | ORAL_TABLET | ORAL | Status: DC | PRN

## 2016-11-20 MED ORDER — ONDANSETRON HCL 4 MG PO TABS: 4.0000 mg | ORAL_TABLET | ORAL | Status: DC | PRN

## 2016-11-20 MED ORDER — BENZOCAINE-MENTHOL 20-0.5 % EX AERO
1.0000 | INHALATION_SPRAY | CUTANEOUS | Status: DC | PRN
Start: 2016-11-20 — End: 2016-11-21
  Administered 2016-11-20: 1 via TOPICAL
  Filled 2016-11-20: qty 56

## 2016-11-20 MED ORDER — SIMETHICONE 80 MG PO CHEW: 80.0000 mg | CHEWABLE_TABLET | ORAL | Status: DC | PRN

## 2016-11-20 MED ORDER — ONDANSETRON HCL 4 MG/2ML IJ SOLN: 4.0000 mg | INTRAMUSCULAR | Status: DC | PRN

## 2016-11-20 MED ORDER — SENNOSIDES-DOCUSATE SODIUM 8.6-50 MG PO TABS
2.0000 | ORAL_TABLET | ORAL | Status: DC
  Administered 2016-11-20: 2 via ORAL
  Filled 2016-11-20: qty 2

## 2016-11-20 MED ORDER — ACETAMINOPHEN 325 MG PO TABS: 650.0000 mg | ORAL_TABLET | ORAL | Status: DC | PRN

## 2016-11-20 MED ORDER — PRENATAL MULTIVITAMIN CH
1.0000 | ORAL_TABLET | Freq: Every day | ORAL | Status: DC
  Administered 2016-11-20 – 2016-11-21 (×2): 1 via ORAL
  Filled 2016-11-20 (×2): qty 1

## 2016-11-20 MED ORDER — ZOLPIDEM TARTRATE 5 MG PO TABS: 5.0000 mg | ORAL_TABLET | Freq: Every evening | ORAL | Status: DC | PRN

## 2016-11-20 MED ORDER — DIPHENHYDRAMINE HCL 25 MG PO CAPS: 25.0000 mg | ORAL_CAPSULE | Freq: Four times a day (QID) | ORAL | Status: DC | PRN

## 2016-11-20 MED ORDER — OXYCODONE HCL 5 MG PO TABS: 10.0000 mg | ORAL_TABLET | ORAL | Status: DC | PRN

## 2016-11-20 MED ORDER — INFLUENZA VAC SPLIT QUAD 0.5 ML IM SUSY
0.5000 mL | PREFILLED_SYRINGE | INTRAMUSCULAR | Status: AC | PRN
  Administered 2016-11-21: 0.5 mL via INTRAMUSCULAR

## 2016-11-20 MED ORDER — FERROUS SULFATE 325 (65 FE) MG PO TABS
325.0000 mg | ORAL_TABLET | Freq: Two times a day (BID) | ORAL | Status: DC
  Administered 2016-11-20 – 2016-11-21 (×3): 325 mg via ORAL
  Filled 2016-11-20 (×3): qty 1

## 2016-11-20 MED ORDER — IBUPROFEN 600 MG PO TABS
600.0000 mg | ORAL_TABLET | Freq: Four times a day (QID) | ORAL | Status: DC
  Administered 2016-11-20 – 2016-11-21 (×6): 600 mg via ORAL
  Filled 2016-11-20 (×6): qty 1

## 2016-11-20 MED ORDER — DIBUCAINE 1 % RE OINT: 1.0000 "application " | TOPICAL_OINTMENT | RECTAL | Status: DC | PRN

## 2016-11-20 MED ORDER — WITCH HAZEL-GLYCERIN EX PADS
1.0000 | MEDICATED_PAD | CUTANEOUS | Status: DC | PRN
Start: 2016-11-20 — End: 2016-11-21

## 2016-11-20 NOTE — Anesthesia Postprocedure Evaluation (Signed)
Anesthesia Post Note  Patient: Rachael Warren  Procedure(s) Performed: AN AD HOC LABOR EPIDURAL     Patient location during evaluation: Mother Baby Anesthesia Type: Epidural Level of consciousness: awake, awake and alert, oriented and patient cooperative Pain management: pain level controlled Vital Signs Assessment: post-procedure vital signs reviewed and stable Respiratory status: spontaneous breathing, nonlabored ventilation and respiratory function stable Cardiovascular status: stable Postop Assessment: no headache, no backache, epidural receding, patient able to bend at knees and no apparent nausea or vomiting Anesthetic complications: no    Last Vitals:  Vitals:   11/20/16 0256 11/20/16 0701  BP: (!) 112/46 116/61  Pulse: 70 81  Resp: 18 16  Temp: (!) 36.2 C 37.3 C  SpO2:      Last Pain:  Vitals:   11/20/16 0701  TempSrc: Oral  PainSc: 0-No pain   Pain Goal:                 Kirah Stice L

## 2016-11-20 NOTE — Plan of Care (Signed)
Progressing appropriately. Up to bathroom.

## 2016-11-20 NOTE — Lactation Note (Signed)
This note was copied from a baby's chart. Lactation Consultation Note  Patient Name: Rachael Warren Reason for consult: Initial assessment  Baby 15 hours old. Mom reports that her nurse has been assisting to latch the baby and he has been nursing well. However, baby has just returned from circumcision and is sleeping. Discussed infant behavior and enc nursing STS. Mom aware of OP/BFSG and LC phone line assistance after D/C.   Maternal Data Has patient been taught Hand Expression?: Yes Does the patient have breastfeeding experience prior to this delivery?: Yes  Feeding Feeding Type: Breast Fed Length of feed: 15 min  LATCH Score                   Interventions    Lactation Tools Discussed/Used     Consult Status Consult Status: Follow-up Date: 11/21/16 Follow-up type: In-patient    Rachael Warren Warren, 2:44 PM

## 2016-11-20 NOTE — Progress Notes (Signed)
RN called CRNA regarding R foot numbness  S: Pt comfortable but complains of inability to "pull foot towards me or lift when walking. I have normal sensation on the bottom of my foot but slightly decreased on top." She states she pushed for 90 minutes and stirups were utilized. She states she had similar episode with L foot after last delivery but did not know the cause. Pt denies back pain.   O: AF, VSS Gen: NAD CV: RRR Pulm: CTAB Back: c/d/i, no pain, no erythema Ext: Normal quad strength bilaterally. Normal plantar flexion bilaterally. Sensation intact over dorsal aspect of L foot. Sensation slightly reduced over dorsal aspect of R foot.   A/P: 36 y/o F s/p labor epidural and subsequent vaginal delivery.  - Current findings suggest peroneal nerve palsy or injury likely secondary to compression or direct trauma at head of fibula. - In most cases, the symptoms resolve without intervention.  - Recommend monitoring and ambulation precautions until resolution noted.  - Pt informed and understands.  - Dr. Cherly Hensenousins to be made aware. RN informed.    Kaylyn LayerKevin D. Hart RochesterHollis, MD, [facility] Anesthesiology

## 2016-11-20 NOTE — Progress Notes (Signed)
On morning assessment, pt told RN BLE sensation normal but her right foot felt heavy.  Pt ambulating in the room without difficulty.  On reassessment at 1530, Pt told RN that right foot felt like it was asleep and tingly but was not getting any better towards normal sensation.  CRNA notified.

## 2016-11-20 NOTE — Progress Notes (Signed)
PPD1 SVD:   S:  Pt reports feeling good/ Tolerating po/ Voiding without problems/ No n/v/ Bleeding is moderate/ Pain controlled withprescription NSAID's including motrin  Newborn info female BRF for circ   O:  A & O x 3  stable/ VS: Blood pressure 116/61, pulse 81, temperature 99.1 F (37.3 C), temperature source Oral, resp. rate 16, height 5\' 5"  (1.651 m), weight 98.9 kg (218 lb), SpO2 96 %, unknown if currently breastfeeding.  LABS:  Results for orders placed or performed during the hospital encounter of 11/19/16 (from the past 24 hour(s))  CBC     Status: Abnormal   Collection Time: 11/20/16  5:51 AM  Result Value Ref Range   WBC 11.2 (H) 4.0 - 10.5 K/uL   RBC 3.89 3.87 - 5.11 MIL/uL   Hemoglobin 10.5 (L) 12.0 - 15.0 g/dL   HCT 16.132.2 (L) 09.636.0 - 04.546.0 %   MCV 82.8 78.0 - 100.0 fL   MCH 27.0 26.0 - 34.0 pg   MCHC 32.6 30.0 - 36.0 g/dL   RDW 40.913.1 81.111.5 - 91.415.5 %   Platelets 178 150 - 400 K/uL    I&O: I/O last 3 completed shifts: In: -  Out: 775 [Urine:525; Blood:250]   No intake/output data recorded.  Lungs: chest clear, no wheezing, rales, normal symmetric air entry, Heart exam - S1, S2 normal, no murmur, no gallop, rate regular  Heart: regular rate and rhythm, S1, S2 normal, no murmur, click, rub or gallop  Abdomen: soft uterus firm non tender @ umb  Perineum: is normal  Lochia: mod  Extremities:no redness or tenderness in the calves or thighs, no edema    A/P: PPD # 1/ [id number]  Doing well  Continue routine post partum orders  Anticipate d/c in am

## 2016-11-21 DIAGNOSIS — G5731 Lesion of lateral popliteal nerve, right lower limb: Secondary | ICD-10-CM | POA: Diagnosis not present

## 2016-11-21 MED ORDER — BENZOCAINE-MENTHOL 20-0.5 % EX AERO: 1.0000 "application " | INHALATION_SPRAY | CUTANEOUS | Status: DC | PRN

## 2016-11-21 MED ORDER — COCONUT OIL OIL: 1.0000 "application " | TOPICAL_OIL | 0 refills | Status: DC | PRN

## 2016-11-21 MED ORDER — ACETAMINOPHEN 325 MG PO TABS: 650.0000 mg | ORAL_TABLET | ORAL | Status: DC | PRN

## 2016-11-21 MED ORDER — IBUPROFEN 600 MG PO TABS: 600.0000 mg | ORAL_TABLET | Freq: Four times a day (QID) | ORAL | 0 refills | Status: DC

## 2016-11-21 NOTE — Discharge Summary (Signed)
Obstetric Discharge Summary Reason for Admission: induction of labor Prenatal Procedures: ultrasound Intrapartum Procedures: vacuum, GBS prophylaxis and epidural Postpartum Procedures: none Complications-Operative and Postpartum: left labia minora laceration repaired and right foot nerve(peroneal) palsy due to compression Hemoglobin  Date Value Ref Range Status  11/20/2016 10.5 (L) 12.0 - 15.0 g/dL Final   HCT  Date Value Ref Range Status  11/20/2016 32.2 (L) 36.0 - 46.0 % Final    Physical Exam:  General: alert, cooperative and no distress Lochia: appropriate Uterine Fundus: firm Incision: healing well DVT Evaluation: No cords or calf tenderness. No significant calf/ankle edema. Ambulating with slow gait, no assistance necessary  Discharge Diagnoses: Term Pregnancy-delivered and right peroneal nerve palsy  Discharge Information: Date: 11/21/2016 Activity: pelvic rest, ambulate with care Diet: routine Medications:  Allergies as of 11/21/2016   No Known Allergies     Medication List    TAKE these medications   acetaminophen 325 MG tablet Commonly known as:  TYLENOL Take 2 tablets (650 mg total) every 4 (four) hours as needed by mouth (for pain scale < 4).   benzocaine-Menthol 20-0.5 % Aero Commonly known as:  DERMOPLAST Apply 1 application as needed topically for irritation (perineal discomfort).   coconut oil Oil Apply 1 application as needed topically.   ibuprofen 600 MG tablet Commonly known as:  ADVIL,MOTRIN Take 1 tablet (600 mg total) every 6 (six) hours by mouth.   pantoprazole 40 MG tablet Commonly known as:  PROTONIX Take 40 mg 2 (two) times daily by mouth.   prenatal multivitamin Tabs tablet Take 1 tablet by mouth daily at 12 noon.            Discharge Care Instructions  (From admission, onward)        Start     Ordered   11/21/16 0000  Discharge wound care:    Comments:  Sitz baths 2 times /day with warm water x 1 week   11/21/16 0836      Condition: stable Instructions: refer to practice specific booklet Discharge to: home Follow-up Information    Maxie Betterousins, Hetal Proano, MD. Schedule an appointment as soon as possible for a visit in 6 week(s).   Specialty:  Obstetrics and Gynecology Contact information: [address] [place] [address] [phone]           Newborn Data: Live born female Denny Peonvery Birth Weight: 7 lb 14.1 oz (3575 g) APGAR: 9, 9  Newborn Delivery   Birth date/time:  11/19/2016 23:28:00 Delivery type:  Vaginal, Vacuum (Extractor)     Home with mother.  Neta MendsDaniela C Paul, CNM 11/21/2016, 8:37 AM

## 2017-01-06 ENCOUNTER — Telehealth: Payer: Self-pay | Admitting: Neurology

## 2017-01-06 NOTE — Telephone Encounter (Signed)
Sue [facility]ushAndrea with [facility] calling about pt is really wanting her to be sooner if possible. Sue Lushndrea is wanting a call back at  [phone] to discuss the conation of pt

## 2017-01-06 NOTE — Telephone Encounter (Signed)
Sp[facility] to [place] at [facility] - pt has a pending new patient appt with Dr. Terrace ArabiaYan on 02/09/17 and needs to be seen sooner.  Dr. Terrace ArabiaYan had a cancellation on 01/07/17 at 11:30 (arrival time 11am) and the patient has been moved to that date.  Sue Lushndrea will call the patient to notify her of the new date and time.

## 2017-01-07 ENCOUNTER — Encounter: Payer: Self-pay | Admitting: Neurology

## 2017-01-07 ENCOUNTER — Ambulatory Visit (INDEPENDENT_AMBULATORY_CARE_PROVIDER_SITE_OTHER): Admitting: Neurology

## 2017-01-07 VITALS — BP 120/75 | HR 83 | Ht 65.0 in | Wt 207.0 lb

## 2017-01-07 DIAGNOSIS — M2141 Flat foot [pes planus] (acquired), right foot: Secondary | ICD-10-CM

## 2017-01-07 DIAGNOSIS — R29898 Other symptoms and signs involving the musculoskeletal system: Secondary | ICD-10-CM

## 2017-01-07 NOTE — Progress Notes (Signed)
PATIENT: Rachael Warren DOB: [birth year]  Chief Complaint  Patient presents with  . Right Foot Nerve Palsy    She has been experiencing right foot nerve palsy since delivering her baby seven weeks ago.  The symptoms have been gradually improving.  Rachael Leeks, MD     HISTORICAL  Rachael Warren is a 37 year old female, seen in refer by her obstetrician Dr. Cherly Hensen, Nena Jordan, for evaluation of right foot numbness, weakness, initial evaluation was on January 07, 2017.  She had epidural injection on November 20, 2016, last about 6 hours, then she noticed gradual recovery of left lower extremity, but continued dense numbness involving right leg, especially right lateral leg, top and bottom of right foot, she noticed numbness tingling needle prick sensation, and also right ankle dorsiflexion weakness, at its worst, she can still ambulating, but with mild limp due to right ankle weakness.  Over the past few weeks she had a rapid recovery, around Christmas time, and she noticed significant improvement, she only noticed mild sensory change at right medial plantar surface, but no longer has significant ankle weakness, but still few weird during the first few steps,  She denies bowel and bladder incontinence, no significant low back pain  REVIEW OF SYSTEMS: Full 14 system review of systems performed and notable only for as above.  ALLERGIES: No Known Allergies  HOME MEDICATIONS: Current Outpatient Medications  Medication Sig Dispense Refill  . ibuprofen (ADVIL,MOTRIN) 600 MG tablet Take 1 tablet (600 mg total) every 6 (six) hours by mouth. 30 tablet 0  . pantoprazole (PROTONIX) 40 MG tablet Take 40 mg by mouth as needed.     . Prenatal Vit-Fe Fumarate-FA (PRENATAL MULTIVITAMIN) TABS tablet Take 1 tablet by mouth daily at 12 noon.     No current facility-administered medications for this visit.     PAST MEDICAL HISTORY: Past Medical History:  Diagnosis Date  .  GERD (gastroesophageal reflux disease)   . Medical history non-contributory   . Postpartum care following vaginal delivery (10/25) 10/29/2013    PAST SURGICAL HISTORY: Past Surgical History:  Procedure Laterality Date  . TONSILLECTOMY    . TONSILLECTOMY  age 33    FAMILY HISTORY: Family History  Problem Relation Age of Onset  . Diabetes Paternal Grandmother   . Heart disease Paternal Grandfather   . Heart attack Paternal Grandfather   . Brain cancer Maternal Aunt   . Healthy Mother   . Healthy Father     SOCIAL HISTORY:  Social History   Socioeconomic History  . Marital status: Single    Spouse name: Not on file  . Number of children: 2  . Years of education: Doctorate  . Highest education level: Not on file  Social Needs  . Financial resource strain: Not on file  . Food insecurity - worry: Not on file  . Food insecurity - inability: Not on file  . Transportation needs - medical: Not on file  . Transportation needs - non-medical: Not on file  Occupational History  . Occupation: Education officer, community  Tobacco Use  . Smoking status: Never Smoker  . Smokeless tobacco: Never Used  Substance and Sexual Activity  . Alcohol use: Yes    Comment: 1-2 drinks per month  . Drug use: No  . Sexual activity: Not on file  Other Topics Concern  . Not on file  Social History Narrative   Lives at home with significant other.   Right-handed.   No daily  caffeine use.     PHYSICAL EXAM   Vitals:   01/07/17 1124  BP: 120/75  Pulse: 83  Weight: 207 lb (93.9 kg)  Height: 5\' 5"  (1.651 m)    Not recorded      Body mass index is 34.45 kg/m.  PHYSICAL EXAMNIATION:  Gen: NAD, conversant, well nourised, obese, well groomed                     Cardiovascular: Regular rate rhythm, no peripheral edema, warm, nontender. Eyes: Conjunctivae clear without exudates or hemorrhage Neck: Supple, no carotid bruits. Pulmonary: Clear to auscultation bilaterally   NEUROLOGICAL EXAM:  MENTAL  STATUS: Speech:    Speech is normal; fluent and spontaneous with normal comprehension.  Cognition:     Orientation to time, place and person     Normal recent and remote memory     Normal Attention span and concentration     Normal Language, naming, repeating,spontaneous speech     Fund of knowledge   CRANIAL NERVES: CN II: Visual fields are full to confrontation. Fundoscopic exam is normal with sharp discs and no vascular changes. Pupils are round equal and briskly reactive to light. CN III, IV, VI: extraocular movement are normal. No ptosis. CN V: Facial sensation is intact to pinprick in all 3 divisions bilaterally. Corneal responses are intact.  CN VII: Face is symmetric with normal eye closure and smile. CN VIII: Hearing is normal to rubbing fingers CN IX, X: Palate elevates symmetrically. Phonation is normal. CN XI: Head turning and shoulder shrug are intact CN XII: Tongue is midline with normal movements and no atrophy.  MOTOR: She has mild right toe extension/flexion weakness, there is no sensory loss right first webspace,  REFLEXES: Reflexes are 2+ and symmetric at the biceps, triceps, knees, and ankles. Plantar responses are flexor.  SENSORY: Decreased light touch right medial plantar foot  COORDINATION: Rapid alternating movements and fine finger movements are intact. There is no dysmetria on finger-to-nose and heel-knee-shin.    GAIT/STANCE: Posture is normal. Gait is steady with normal steps, base, arm swing, and turning. Heel and toe walking are normal. Tandem gait is normal.  Romberg is absent.   DIAGNOSTIC DATA (LABS, IMAGING, TESTING) - I reviewed patient records, labs, notes, testing and imaging myself where available.   ASSESSMENT AND PLAN  Rachael Warren is a 37 y.o. female   Right foot paresthesia, weakness following epidural on Nov 20 2016.  Paresthesia involving top and bottom of right foot, now with residue right toe extension/flexion weakness,  sensory loss at right medial plantar surface with well preserved right 1st webspace sensation and right ankle reflex.  Potential localization to right L5 nerve root,   She has steady rapid improvement of her symptoms, I would expect she will continue to improve to total recovery    Levert FeinsteinYijun Bettymae Yott, M.D. Ph.D.  [facility] [address], [address] [place], [place] [address] Ph: [phone] Fax: [phone]  CC: Maxie Betterousins, Sheronette, MD

## 2017-02-09 ENCOUNTER — Ambulatory Visit: Admitting: Neurology

## 2017-10-04 HISTORY — PX: DILATION AND CURETTAGE OF UTERUS: SHX78

## 2017-10-15 ENCOUNTER — Ambulatory Visit
Admission: RE | Admit: 2017-10-15 | Discharge: 2017-10-15 | Disposition: A | Discharge status: Home / Self Care | Payer: Self-pay | Source: Ambulatory Visit | Attending: Obstetrics and Gynecology | Admitting: Obstetrics and Gynecology

## 2017-10-15 ENCOUNTER — Other Ambulatory Visit: Payer: Self-pay | Admitting: Obstetrics and Gynecology

## 2017-10-15 DIAGNOSIS — T8332XS Displacement of intrauterine contraceptive device, sequela: Secondary | ICD-10-CM

## 2017-11-08 ENCOUNTER — Other Ambulatory Visit: Payer: Self-pay

## 2017-11-08 ENCOUNTER — Encounter (HOSPITAL_COMMUNITY): Payer: Self-pay | Admitting: *Deleted

## 2017-11-09 ENCOUNTER — Other Ambulatory Visit: Payer: Self-pay | Admitting: Obstetrics and Gynecology

## 2017-11-29 ENCOUNTER — Encounter (HOSPITAL_COMMUNITY): Payer: Self-pay

## 2017-11-29 ENCOUNTER — Encounter (HOSPITAL_COMMUNITY)
Admission: AD | Disposition: A | Discharge status: Home / Self Care | Payer: Self-pay | Source: Ambulatory Visit | Attending: Obstetrics and Gynecology

## 2017-11-29 ENCOUNTER — Other Ambulatory Visit: Payer: Self-pay

## 2017-11-29 ENCOUNTER — Ambulatory Visit (HOSPITAL_COMMUNITY): Admitting: Anesthesiology

## 2017-11-29 ENCOUNTER — Ambulatory Visit (HOSPITAL_COMMUNITY)
Admission: AD | Admit: 2017-11-29 | Discharge: 2017-11-29 | Disposition: A | Discharge status: Home / Self Care | Source: Ambulatory Visit | Attending: Obstetrics and Gynecology | Admitting: Obstetrics and Gynecology

## 2017-11-29 DIAGNOSIS — T8332XA Displacement of intrauterine contraceptive device, initial encounter: Secondary | ICD-10-CM | POA: Diagnosis present

## 2017-11-29 DIAGNOSIS — Y762 Prosthetic and other implants, materials and accessory obstetric and gynecological devices associated with adverse incidents: Secondary | ICD-10-CM | POA: Diagnosis not present

## 2017-11-29 HISTORY — PX: LAPAROSCOPY: SHX197

## 2017-11-29 HISTORY — PX: HYSTEROSCOPY WITH D & C: SHX1775

## 2017-11-29 LAB — CBC
HEMATOCRIT: 39.1 % (ref 36.0–46.0)
Hemoglobin: 12.8 g/dL (ref 12.0–15.0)
MCH: 27.6 pg (ref 26.0–34.0)
MCHC: 32.7 g/dL (ref 30.0–36.0)
MCV: 84.3 fL (ref 80.0–100.0)
Platelets: 196 10*3/uL (ref 150–400)
RBC: 4.64 MIL/uL (ref 3.87–5.11)
RDW: 13 % (ref 11.5–15.5)
WBC: 3.4 10*3/uL — AB (ref 4.0–10.5)
nRBC: 0 % (ref 0.0–0.2)

## 2017-11-29 LAB — PREGNANCY, URINE: PREG TEST UR: NEGATIVE

## 2017-11-29 SURGERY — LAPAROSCOPY, DIAGNOSTIC
Anesthesia: General | Site: Abdomen

## 2017-11-29 MED ORDER — SUGAMMADEX SODIUM 200 MG/2ML IV SOLN
INTRAVENOUS | Status: AC
  Filled 2017-11-29: qty 2

## 2017-11-29 MED ORDER — OXYCODONE HCL 5 MG PO TABS: 5.0000 mg | ORAL_TABLET | Freq: Once | ORAL | Status: DC | PRN

## 2017-11-29 MED ORDER — KETOROLAC TROMETHAMINE 30 MG/ML IJ SOLN
INTRAMUSCULAR | Status: DC | PRN
  Administered 2017-11-29: 30 mg via INTRAVENOUS

## 2017-11-29 MED ORDER — ONDANSETRON HCL 4 MG/2ML IJ SOLN
INTRAMUSCULAR | Status: AC
  Filled 2017-11-29: qty 2

## 2017-11-29 MED ORDER — OXYCODONE HCL 5 MG/5ML PO SOLN: 5.0000 mg | Freq: Once | ORAL | Status: DC | PRN

## 2017-11-29 MED ORDER — DEXAMETHASONE SODIUM PHOSPHATE 10 MG/ML IJ SOLN
INTRAMUSCULAR | Status: DC | PRN
  Administered 2017-11-29: 10 mg via INTRAVENOUS

## 2017-11-29 MED ORDER — ROCURONIUM BROMIDE 10 MG/ML (PF) SYRINGE
PREFILLED_SYRINGE | INTRAVENOUS | Status: DC | PRN
  Administered 2017-11-29: 10 mg via INTRAVENOUS
  Administered 2017-11-29: 50 mg via INTRAVENOUS

## 2017-11-29 MED ORDER — SCOPOLAMINE 1 MG/3DAYS TD PT72
MEDICATED_PATCH | TRANSDERMAL | Status: AC
  Administered 2017-11-29: 1.5 mg via TRANSDERMAL
  Filled 2017-11-29: qty 1

## 2017-11-29 MED ORDER — CHLOROPROCAINE HCL 1 % IJ SOLN
INTRAMUSCULAR | Status: AC
  Filled 2017-11-29: qty 30

## 2017-11-29 MED ORDER — FENTANYL CITRATE (PF) 250 MCG/5ML IJ SOLN
INTRAMUSCULAR | Status: DC | PRN
  Administered 2017-11-29: 100 ug via INTRAVENOUS
  Administered 2017-11-29 (×3): 50 ug via INTRAVENOUS

## 2017-11-29 MED ORDER — BUPIVACAINE HCL (PF) 0.25 % IJ SOLN
INTRAMUSCULAR | Status: DC | PRN
  Administered 2017-11-29: 9 mL

## 2017-11-29 MED ORDER — MEPERIDINE HCL 25 MG/ML IJ SOLN: 6.2500 mg | INTRAMUSCULAR | Status: DC | PRN

## 2017-11-29 MED ORDER — SCOPOLAMINE 1 MG/3DAYS TD PT72
1.0000 | MEDICATED_PATCH | Freq: Once | TRANSDERMAL | Status: DC
  Administered 2017-11-29: 1.5 mg via TRANSDERMAL

## 2017-11-29 MED ORDER — LIDOCAINE 2% (20 MG/ML) 5 ML SYRINGE
INTRAMUSCULAR | Status: DC | PRN
  Administered 2017-11-29: 60 mg via INTRAVENOUS

## 2017-11-29 MED ORDER — SUGAMMADEX SODIUM 200 MG/2ML IV SOLN
INTRAVENOUS | Status: DC | PRN
  Administered 2017-11-29: 200 mg via INTRAVENOUS

## 2017-11-29 MED ORDER — LACTATED RINGERS IV SOLN
INTRAVENOUS | Status: DC
  Administered 2017-11-29: 13:00:00 via INTRAVENOUS

## 2017-11-29 MED ORDER — ROCURONIUM BROMIDE 100 MG/10ML IV SOLN
INTRAVENOUS | Status: AC
  Filled 2017-11-29: qty 1

## 2017-11-29 MED ORDER — SODIUM CHLORIDE 0.9 % IV SOLN
100.0000 mg | Freq: Two times a day (BID) | INTRAVENOUS | Status: DC
  Administered 2017-11-29: 100 mg via INTRAVENOUS
  Filled 2017-11-29 (×3): qty 100

## 2017-11-29 MED ORDER — MIDAZOLAM HCL 2 MG/2ML IJ SOLN
INTRAMUSCULAR | Status: AC
  Filled 2017-11-29: qty 2

## 2017-11-29 MED ORDER — SODIUM CHLORIDE (PF) 0.9 % IJ SOLN
INTRAMUSCULAR | Status: AC
  Filled 2017-11-29: qty 10

## 2017-11-29 MED ORDER — FENTANYL CITRATE (PF) 250 MCG/5ML IJ SOLN
INTRAMUSCULAR | Status: AC
  Filled 2017-11-29: qty 5

## 2017-11-29 MED ORDER — FENTANYL CITRATE (PF) 100 MCG/2ML IJ SOLN: 25.0000 ug | INTRAMUSCULAR | Status: DC | PRN

## 2017-11-29 MED ORDER — BUPIVACAINE HCL (PF) 0.25 % IJ SOLN
INTRAMUSCULAR | Status: AC
  Filled 2017-11-29: qty 30

## 2017-11-29 MED ORDER — PROPOFOL 10 MG/ML IV BOLUS
INTRAVENOUS | Status: DC | PRN
  Administered 2017-11-29: 200 mg via INTRAVENOUS

## 2017-11-29 MED ORDER — HYDROCODONE-ACETAMINOPHEN 5-325 MG PO TABS: 1.0000 | ORAL_TABLET | Freq: Four times a day (QID) | ORAL | 0 refills | Status: AC | PRN

## 2017-11-29 MED ORDER — PROPOFOL 10 MG/ML IV BOLUS
INTRAVENOUS | Status: AC
  Filled 2017-11-29: qty 20

## 2017-11-29 MED ORDER — LIDOCAINE HCL (CARDIAC) PF 100 MG/5ML IV SOSY
PREFILLED_SYRINGE | INTRAVENOUS | Status: AC
  Filled 2017-11-29: qty 5

## 2017-11-29 MED ORDER — DOXYCYCLINE HYCLATE 100 MG PO CAPS: 100.0000 mg | ORAL_CAPSULE | Freq: Two times a day (BID) | ORAL | 0 refills | Status: AC

## 2017-11-29 MED ORDER — ACETAMINOPHEN 325 MG PO TABS: 325.0000 mg | ORAL_TABLET | ORAL | Status: DC | PRN

## 2017-11-29 MED ORDER — ONDANSETRON HCL 4 MG/2ML IJ SOLN: 4.0000 mg | Freq: Once | INTRAMUSCULAR | Status: DC | PRN

## 2017-11-29 MED ORDER — DEXAMETHASONE SODIUM PHOSPHATE 10 MG/ML IJ SOLN
INTRAMUSCULAR | Status: AC
  Filled 2017-11-29: qty 1

## 2017-11-29 MED ORDER — MIDAZOLAM HCL 5 MG/5ML IJ SOLN
INTRAMUSCULAR | Status: DC | PRN
  Administered 2017-11-29: 2 mg via INTRAVENOUS

## 2017-11-29 MED ORDER — ONDANSETRON HCL 4 MG/2ML IJ SOLN
INTRAMUSCULAR | Status: DC | PRN
  Administered 2017-11-29: 4 mg via INTRAVENOUS

## 2017-11-29 MED ORDER — ACETAMINOPHEN 160 MG/5ML PO SOLN: 325.0000 mg | ORAL | Status: DC | PRN

## 2017-11-29 SURGICAL SUPPLY — 43 items
APPLICATOR COTTON TIP 6 STRL (MISCELLANEOUS) ×2 IMPLANT
APPLICATOR COTTON TIP 6IN STRL (MISCELLANEOUS) ×4
BIPOLAR CUTTING LOOP 21FR (ELECTRODE)
CABLE HIGH FREQUENCY MONO STRZ (ELECTRODE) IMPLANT
CATH ROBINSON RED A/P 16FR (CATHETERS) ×4 IMPLANT
COVER BACK TABLE 60X90IN (DRAPES) ×4 IMPLANT
COVER TABLE BACK 60X90 (DRAPES) ×4 IMPLANT
DERMABOND ADVANCED (GAUZE/BANDAGES/DRESSINGS) ×2
DERMABOND ADVANCED .7 DNX12 (GAUZE/BANDAGES/DRESSINGS) ×2 IMPLANT
DRSG OPSITE POSTOP 3X4 (GAUZE/BANDAGES/DRESSINGS) IMPLANT
ELECT REM PT RETURN 9FT ADLT (ELECTROSURGICAL) ×4
ELECTRODE REM PT RTRN 9FT ADLT (ELECTROSURGICAL) ×2 IMPLANT
FILTER SMOKE EVAC LAPAROSHD (FILTER) IMPLANT
FORCEPS CUTTING 33CM 5MM (CUTTING FORCEPS) IMPLANT
FORCEPS CUTTING 45CM 5MM (CUTTING FORCEPS) IMPLANT
GAUZE 4X4 16PLY RFD (DISPOSABLE) ×4 IMPLANT
GLOVE BIOGEL PI IND STRL 7.0 (GLOVE) ×6 IMPLANT
GLOVE BIOGEL PI INDICATOR 7.0 (GLOVE) ×6
GLOVE ECLIPSE 6.5 STRL STRAW (GLOVE) ×4 IMPLANT
GOWN STRL REUS W/TWL LRG LVL3 (GOWN DISPOSABLE) ×12 IMPLANT
KIT PROCEDURE FLUENT (KITS) ×4 IMPLANT
LOOP CUTTING BIPOLAR 21FR (ELECTRODE) IMPLANT
NEEDLE INSUFFLATION 120MM (ENDOMECHANICALS) ×4 IMPLANT
NS IRRIG 1000ML POUR BTL (IV SOLUTION) ×4 IMPLANT
PACK LAPAROSCOPY BASIN (CUSTOM PROCEDURE TRAY) ×4 IMPLANT
PACK TRENDGUARD 450 HYBRID PRO (MISCELLANEOUS) ×2 IMPLANT
PACK VAGINAL MINOR WOMEN LF (CUSTOM PROCEDURE TRAY) IMPLANT
PAD OB MATERNITY 4.3X12.25 (PERSONAL CARE ITEMS) ×4 IMPLANT
POUCH SPECIMEN RETRIEVAL 10MM (ENDOMECHANICALS) IMPLANT
PROTECTOR NERVE ULNAR (MISCELLANEOUS) ×8 IMPLANT
SCISSORS LAP 5X35 DISP (ENDOMECHANICALS) IMPLANT
SET IRRIG TUBING LAPAROSCOPIC (IRRIGATION / IRRIGATOR) IMPLANT
SLEEVE XCEL OPT CAN 5 100 (ENDOMECHANICALS) ×4 IMPLANT
SOLUTION ELECTROLUBE (MISCELLANEOUS) IMPLANT
SUT VICRYL 0 UR6 27IN ABS (SUTURE) ×4 IMPLANT
SUT VICRYL 4-0 PS2 18IN ABS (SUTURE) ×4 IMPLANT
TOWEL OR 17X24 6PK STRL BLUE (TOWEL DISPOSABLE) ×8 IMPLANT
TRENDGUARD 450 HYBRID PRO PACK (MISCELLANEOUS) ×4
TROCAR BALLN 12MMX100 BLUNT (TROCAR) IMPLANT
TROCAR OPTI TIP 5M 100M (ENDOMECHANICALS) ×4 IMPLANT
TROCAR XCEL DIL TIP R 11M (ENDOMECHANICALS) ×4 IMPLANT
TUBING INSUF HEATED (TUBING) ×8 IMPLANT
WARMER LAPAROSCOPE (MISCELLANEOUS) ×4 IMPLANT

## 2017-11-29 NOTE — Anesthesia Preprocedure Evaluation (Signed)
Anesthesia Evaluation  Patient identified by MRN, date of birth, ID band Patient awake    Reviewed: Allergy & Precautions, H&P , NPO status , Patient's Chart, lab work & pertinent test results, reviewed documented beta blocker date and time   Airway Mallampati: I  TM Distance: >3 FB Neck ROM: full    Dental no notable dental hx.    Pulmonary neg pulmonary ROS,    Pulmonary exam normal breath sounds clear to auscultation       Cardiovascular Exercise Tolerance: Good negative cardio ROS   Rhythm:regular Rate:Normal     Neuro/Psych negative neurological ROS  negative psych ROS   GI/Hepatic negative GI ROS, Neg liver ROS,   Endo/Other  negative endocrine ROS  Renal/GU negative Renal ROS  negative genitourinary   Musculoskeletal   Abdominal   Peds  Hematology negative hematology ROS (+)   Anesthesia Other Findings   Reproductive/Obstetrics negative OB ROS                             Anesthesia Physical Anesthesia Plan  ASA: I  Anesthesia Plan: General   Post-op Pain Management:    Induction: Intravenous  PONV Risk Score and Plan:   Airway Management Planned: Oral ETT  Additional Equipment:   Intra-op Plan:   Post-operative Plan: Extubation in OR  Informed Consent: I have reviewed the patients History and Physical, chart, labs and discussed the procedure including the risks, benefits and alternatives for the proposed anesthesia with the patient or authorized representative who has indicated his/her understanding and acceptance.   Dental Advisory Given  Plan Discussed with: CRNA  Anesthesia Plan Comments: (  )        Anesthesia Quick Evaluation

## 2017-11-29 NOTE — Brief Op Note (Signed)
11/29/2017  3:39 PM  PATIENT:  Ardine BjorkAnn L Heinrichs  37 y.o. female  PRE-OPERATIVE DIAGNOSIS:  Malpositioned IUD  POST-OPERATIVE DIAGNOSIS:  Malpositioned IUD,  IUD uterine perforation  PROCEDURE:  Diagnostic hysteroscopy, dx laparoscopy with IUD removal  SURGEON:  Surgeon(s) and Role:    * Marly Schuld, Nena JordanSheronette, MD - Primary   ASSISTANTS: TRACY RNFA  ANESTHESIA:   general FINDINGS;  anterior wall IUD uterine perforation, nl tubes and ovaries, nl liver edge, nl ant and post cul de [employer] Wings of IUD partially in bowel epiploic fat EBL:  5 mL   BLOOD ADMINISTERED:none  DRAINS: none   LOCAL MEDICATIONS USED:  MARCAINE     SPECIMEN:  No Specimen  DISPOSITION OF SPECIMEN:  N/A  COUNTS:  YES  TOURNIQUET:  * No tourniquets in log *  DICTATION: .Other Dictation: Dictation Number [id number]  PLAN OF CARE: Discharge to home after PACU  PATIENT DISPOSITION:  PACU - hemodynamically stable.   Delay start of Pharmacological VTE agent (>24hrs) due to surgical blood loss or risk of bleeding: no

## 2017-11-29 NOTE — Transfer of Care (Signed)
Immediate Anesthesia Transfer of Care Note  Patient: Ardine BjorkAnn L Pickler  Procedure(s) Performed: LAPAROSCOPY DIAGNOSTIC/Removal of IUD (N/A Abdomen) DILATATION AND CURETTAGE /HYSTEROSCOPY (N/A )  Patient Location: PACU  Anesthesia Type:General  Level of Consciousness: awake, alert , oriented and patient cooperative  Airway & Oxygen Therapy: Patient Spontanous Breathing and Patient connected to nasal cannula oxygen  Post-op Assessment: Report given to RN, Post -op Vital signs reviewed and stable and Patient moving all extremities X 4  Post vital signs: Reviewed and stable  Last Vitals:  Vitals Value Taken Time  BP    Temp    Pulse 81 11/29/2017  3:28 PM  Resp 18 11/29/2017  3:28 PM  SpO2 100 % 11/29/2017  3:28 PM  Vitals shown include unvalidated device data.  Last Pain:  Vitals:   11/29/17 1223  TempSrc: Oral  PainSc: 0-No pain      Patients Stated Pain Goal: 4 (11/29/17 1223)  Complications: No apparent anesthesia complications

## 2017-11-29 NOTE — Anesthesia Postprocedure Evaluation (Signed)
Anesthesia Post Note  Patient: Ardine BjorkAnn L Ngo  Procedure(s) Performed: LAPAROSCOPY DIAGNOSTIC/Removal of IUD (N/A Abdomen) DILATATION AND CURETTAGE /HYSTEROSCOPY (N/A )     Patient location during evaluation: PACU Anesthesia Type: General Level of consciousness: awake and alert and oriented Pain management: pain level controlled Vital Signs Assessment: post-procedure vital signs reviewed and stable Respiratory status: spontaneous breathing, nonlabored ventilation and respiratory function stable Cardiovascular status: blood pressure returned to baseline and stable Postop Assessment: no apparent nausea or vomiting Anesthetic complications: no    Last Vitals:  Vitals:   11/29/17 1600 11/29/17 1615  BP: 109/65 105/60  Pulse: 61 61  Resp: 15 13  Temp:    SpO2: 100% 100%    Last Pain:  Vitals:   11/29/17 1600  TempSrc:   PainSc: 0-No pain   Pain Goal: Patients Stated Pain Goal: 4 (11/29/17 1223)               Eilish Mcdaniel A.

## 2017-11-29 NOTE — Anesthesia Procedure Notes (Addendum)
Procedure Name: Intubation Date/Time: 11/29/2017 2:07 PM Performed by: Talbot Grumbling, CRNA Pre-anesthesia Checklist: Patient identified, Emergency Drugs available, Suction available and Patient being monitored Patient Re-evaluated:Patient Re-evaluated prior to induction Oxygen Delivery Method: Circle system utilized Preoxygenation: Pre-oxygenation with 100% oxygen Induction Type: IV induction Ventilation: Mask ventilation without difficulty Laryngoscope Size: Mac and 3 Grade View: Grade I Tube type: Oral Tube size: 7.0 mm Airway Equipment and Method: Stylet Placement Confirmation: ETT inserted through vocal cords under direct vision,  positive ETCO2 and breath sounds checked- equal and bilateral Secured at: 22 cm Tube secured with: Tape Dental Injury: Teeth and Oropharynx as per pre-operative assessment

## 2017-11-29 NOTE — Discharge Instructions (Signed)
DISCHARGE INSTRUCTIONS: Laparoscopy ° °The following instructions have been prepared to help you care for yourself upon your return home today. ° °Wound care: °• Do not get the incision wet for the first 24 hours. The incision should be kept clean and dry. °• The Band-Aids or dressings may be removed the day after surgery. °• Should the incision become sore, red, and swollen after the first week, check with your doctor. ° °Personal hygiene: °• Shower the day after your procedure. ° °Activity and limitations: °• Do NOT drive or operate any equipment today. °• Do NOT lift anything more than 15 pounds for 2-3 weeks after surgery. °• Do NOT rest in bed all day. °• Walking is encouraged. Walk each day, starting slowly with 5-minute walks 3 or 4 times a day. Slowly increase the length of your walks. °• Walk up and down stairs slowly. °• Do NOT do strenuous activities, such as golfing, playing tennis, bowling, running, biking, weight lifting, gardening, mowing, or vacuuming for 2-4 weeks. Ask your doctor when it is okay to start. ° °Diet: Eat a light meal as desired this evening. You may resume your usual diet tomorrow. ° °Return to work: This is dependent on the type of work you do. For the most part you can return to a desk job within a week of surgery. If you are more active at work, please discuss this with your doctor. ° °What to expect after your surgery: You may have a slight burning sensation when you urinate on the first day. You may have a very small amount of blood in the urine. Expect to have a small amount of vaginal discharge/light bleeding for 1-2 weeks. It is not unusual to have abdominal soreness and bruising for up to 2 weeks. You may be tired and need more rest for about 1 week. You may experience shoulder pain for 24-72 hours. Lying flat in bed may relieve it. ° °Call your doctor for any of the following: °• Develop a fever of 100.4 or greater °• Inability to urinate 6 hours after discharge from  hospital °• Severe pain not relieved by pain medications °• Persistent of heavy bleeding at incision site °• Redness or swelling around incision site after a week °• Increasing nausea or vomiting ° °Patient Signature________________________________________ °Nurse Signature_________________________________________DISCHARGE INSTRUCTIONS: Laparoscopy ° °The following instructions have been prepared to help you care for yourself upon your return home today. ° °Wound care: °• Do not get the incision wet for the first 24 hours. The incision should be kept clean and dry. °• The Band-Aids or dressings may be removed the day after surgery. °• Should the incision become sore, red, and swollen after the first week, check with your doctor. ° °Personal hygiene: °• Shower the day after your procedure. ° °Activity and limitations: °• Do NOT drive or operate any equipment today. °• Do NOT lift anything more than 15 pounds for 2-3 weeks after surgery. °• Do NOT rest in bed all day. °• Walking is encouraged. Walk each day, starting slowly with 5-minute walks 3 or 4 times a day. Slowly increase the length of your walks. °• Walk up and down stairs slowly. °• Do NOT do strenuous activities, such as golfing, playing tennis, bowling, running, biking, weight lifting, gardening, mowing, or vacuuming for 2-4 weeks. Ask your doctor when it is okay to start. ° °Diet: Eat a light meal as desired this evening. You may resume your usual diet tomorrow. ° °Return to work: This is dependent on   the type of work you do. For the most part you can return to a desk job within a week of surgery. If you are more active at work, please discuss this with your doctor. ° °What to expect after your surgery: You may have a slight burning sensation when you urinate on the first day. You may have a very small amount of blood in the urine. Expect to have a small amount of vaginal discharge/light bleeding for 1-2 weeks. It is not unusual to have abdominal soreness  and bruising for up to 2 weeks. You may be tired and need more rest for about 1 week. You may experience shoulder pain for 24-72 hours. Lying flat in bed may relieve it. ° °Call your doctor for any of the following: °• Develop a fever of 100.4 or greater °• Inability to urinate 6 hours after discharge from hospital °• Severe pain not relieved by pain medications °• Persistent of heavy bleeding at incision site °• Redness or swelling around incision site after a week °• Increasing nausea or vomiting ° °Patient Signature________________________________________ °Nurse Signature_________________________________________ °

## 2017-11-30 ENCOUNTER — Encounter (HOSPITAL_COMMUNITY): Payer: Self-pay | Admitting: Obstetrics and Gynecology

## 2017-11-30 MED ORDER — SODIUM CHLORIDE 0.9 % IR SOLN
Status: DC | PRN
  Administered 2017-11-29: 1000 mL

## 2017-11-30 NOTE — Op Note (Signed)
NAME: Feldt, Jevaeh L. MEDICAL RECORD ZO:[id number] ACCOUNT [id number] DATE OF BIRTH:January 05, 1981 FACILITY: [facility] LOCATION: [facility] PHYSICIAN:Amitai Delaughter A. Jemar Paulsen, MD  OPERATIVE REPORT  DATE OF PROCEDURE:  11/29/2017  PREOPERATIVE DIAGNOSIS:  Malpositioned intrauterine device.  PROCEDURE PERFORMED:  Diagnostic hysteroscopy, diagnostic laparoscopy with IUD removal using laparoscopy.  POSTOPERATIVE DIAGNOSIS:  Malpositioned intrauterine device,  IUD device uterine perforation.  ANESTHESIA:  General.  SURGEON:  Maxie Better, MD  ASSISTANT:  Karmen Stabs, RNFA.  DESCRIPTION OF PROCEDURE:  Under adequate general anesthesia, the patient was placed in the dorsal lithotomy position.  She was sterilely prepped and draped in the usual fashion.  An indwelling Foley catheter was sterilely placed.  Bivalve speculum was placed in the vagina.  Single tooth tenaculum was placed on the anterior lip of the cervix.  The cervix easily accepted a #17 Pratt dilator.  A diagnostic hysteroscope was then inserted into the uterine cavity.  Both tubal ostia could be seen.  The long IUD string was noted to exit from left anterior uterine wall with the distal end of the IUD itself could not be seen consistent with uterine perforation.  At that point, the hysteroscope was removed.  The single tooth tenaculum and bivalve speculum remained.  Attention was then turned to the abdomen.  Marcaine 0.25% was injected infraumbilically.  An infraumbilical incision was made.  Veress needle was introduced and tested with normal saline.  Opening pressure of 7 was noted, 2.6 liters of CO2 was insufflated.  Veress needle was then removed.  A 10 mm disposable trocar with sleeve was introduced into the abdomen.  A lighted video laparoscope was then inserted.  Entry was confirmed without incident.  The patient was placed in deep Trendelenburg position.  At that point, the IUD could seen anteriorly with 2-3 mm of the string exiting  from anterior wall of the uterus.  The rest of the proximal portion of the IUD was covered with the bowel.  A 5 mm port  site was placed in the right lower quadrant.  A probe was then utilized to gently inspect the uterus.  On inspection, it was noted that the proximal portion of the arms of the IUD was through the epiploic fat of the bowel.  A second 5 mm port was placed in the left lower quadrant to further evaluate that area.  Once all ports were placed under direct visualization, a second probe was then utilized to inspect careful lifting of the IUD and epiploic fat confirmed that the bowels were not injured and the wing of the IUD had perforated through a small edge of epiploic fat .  Using a grasper and countertraction, the IUD was raised and the small amount of fatty tissue through which it had propelled was carefully pulled off of the IUD and thus  collapsing the outstretched arms with removal of the IUD from the fat without incident.  Then, the IUD was grasped and removed from the uterine perforation site.  With manipulation, the IUD was able to be removed intact through the left lower quadrant port. The pelvis and site of uterine perforation was then irrigated.  No active bleeding was noted.  The abdominal pressure was decreased to confirm no active bleeding.  The rest of the pelvis was inspected.  Normal tubes and ovaries were noted.  There was no injury to any other organ surrounding the area.  The posterior cul-de-sac and the anterior cul-de-sac was without any lesions.  The fluid was aspirated and the procedure was terminated by  removing the lower ports under  direct visualization.  Bringing up the infraumbilical port under direct visualization, taking care not to bring up on an underlying structures and deflating the abdomen.  The incisions were then closed with the 0 Vicryl figure-of-eight suture of the fascia infraumbilical and the run incisions were closed with 4-0 Vicryl subcuticular closure.   The instruments from the vagina were removed.  SPECIMEN:  None.  ESTIMATED BLOOD LOSS:  Minimal.  COMPLICATIONS:  None.  The patient tolerated the procedure well and was transferred to recovery room in stable condition.  TN/NUANCE  D:11/29/2017 T:11/30/2017 JOB:[id number]

## 2019-01-20 ENCOUNTER — Ambulatory Visit: Attending: Internal Medicine

## 2019-01-20 DIAGNOSIS — Z20822 Contact with and (suspected) exposure to covid-19: Secondary | ICD-10-CM

## 2019-01-21 LAB — NOVEL CORONAVIRUS, NAA: SARS-CoV-2, NAA: NOT DETECTED

## 2019-02-28 ENCOUNTER — Ambulatory Visit: Payer: 59 | Attending: Internal Medicine

## 2019-02-28 DIAGNOSIS — Z20822 Contact with and (suspected) exposure to covid-19: Secondary | ICD-10-CM | POA: Insufficient documentation

## 2019-03-01 LAB — NOVEL CORONAVIRUS, NAA: SARS-CoV-2, NAA: NOT DETECTED

## 2019-09-08 ENCOUNTER — Other Ambulatory Visit: Payer: Self-pay

## 2019-09-08 DIAGNOSIS — Z20822 Contact with and (suspected) exposure to covid-19: Secondary | ICD-10-CM

## 2019-09-10 LAB — NOVEL CORONAVIRUS, NAA: SARS-CoV-2, NAA: NOT DETECTED

## 2020-02-12 ENCOUNTER — Other Ambulatory Visit: Payer: Self-pay | Admitting: Family Medicine

## 2020-02-12 ENCOUNTER — Ambulatory Visit
Admission: RE | Admit: 2020-02-12 | Discharge: 2020-02-12 | Disposition: A | Discharge status: Home / Self Care | Payer: 59 | Source: Ambulatory Visit | Attending: Family Medicine | Admitting: Family Medicine

## 2020-02-12 DIAGNOSIS — S7012XA Contusion of left thigh, initial encounter: Secondary | ICD-10-CM

## 2021-05-29 ENCOUNTER — Other Ambulatory Visit: Payer: Self-pay | Admitting: Family Medicine

## 2021-05-29 DIAGNOSIS — G43109 Migraine with aura, not intractable, without status migrainosus: Secondary | ICD-10-CM

## 2021-06-02 ENCOUNTER — Encounter (HOSPITAL_BASED_OUTPATIENT_CLINIC_OR_DEPARTMENT_OTHER): Payer: Self-pay | Admitting: Emergency Medicine

## 2021-06-02 ENCOUNTER — Emergency Department (HOSPITAL_BASED_OUTPATIENT_CLINIC_OR_DEPARTMENT_OTHER)
Admission: EM | Admit: 2021-06-02 | Discharge: 2021-06-02 | Disposition: A | Discharge status: Home / Self Care | Payer: Commercial Managed Care - PPO | Attending: Emergency Medicine | Admitting: Emergency Medicine

## 2021-06-02 ENCOUNTER — Other Ambulatory Visit: Payer: Self-pay

## 2021-06-02 DIAGNOSIS — H5500 Unspecified nystagmus: Secondary | ICD-10-CM | POA: Diagnosis not present

## 2021-06-02 DIAGNOSIS — R519 Headache, unspecified: Secondary | ICD-10-CM | POA: Insufficient documentation

## 2021-06-02 DIAGNOSIS — R42 Dizziness and giddiness: Secondary | ICD-10-CM | POA: Insufficient documentation

## 2021-06-02 HISTORY — DX: Dizziness and giddiness: R42

## 2021-06-02 LAB — CBC WITH DIFFERENTIAL/PLATELET
Abs Immature Granulocytes: 0.01 10*3/uL (ref 0.00–0.07)
Basophils Absolute: 0 10*3/uL (ref 0.0–0.1)
Basophils Relative: 1 %
Eosinophils Absolute: 0 10*3/uL (ref 0.0–0.5)
Eosinophils Relative: 1 %
HCT: 43.3 % (ref 36.0–46.0)
Hemoglobin: 14 g/dL (ref 12.0–15.0)
Immature Granulocytes: 0 %
Lymphocytes Relative: 36 %
Lymphs Abs: 1.6 10*3/uL (ref 0.7–4.0)
MCH: 27.5 pg (ref 26.0–34.0)
MCHC: 32.3 g/dL (ref 30.0–36.0)
MCV: 85.1 fL (ref 80.0–100.0)
Monocytes Absolute: 0.3 10*3/uL (ref 0.1–1.0)
Monocytes Relative: 7 %
Neutro Abs: 2.5 10*3/uL (ref 1.7–7.7)
Neutrophils Relative %: 55 %
Platelets: 236 10*3/uL (ref 150–400)
RBC: 5.09 MIL/uL (ref 3.87–5.11)
RDW: 12.4 % (ref 11.5–15.5)
WBC: 4.5 10*3/uL (ref 4.0–10.5)
nRBC: 0 % (ref 0.0–0.2)

## 2021-06-02 LAB — COMPREHENSIVE METABOLIC PANEL
ALT: 10 U/L (ref 0–44)
AST: 15 U/L (ref 15–41)
Albumin: 4.5 g/dL (ref 3.5–5.0)
Alkaline Phosphatase: 48 U/L (ref 38–126)
Anion gap: 10 (ref 5–15)
BUN: 17 mg/dL (ref 6–20)
CO2: 26 mmol/L (ref 22–32)
Calcium: 9.3 mg/dL (ref 8.9–10.3)
Chloride: 103 mmol/L (ref 98–111)
Creatinine, Ser: 0.85 mg/dL (ref 0.44–1.00)
GFR, Estimated: >60 mL/min (ref >60)
Glucose, Bld: 101 mg/dL — ABNORMAL HIGH (ref 70–99)
Potassium: 3.8 mmol/L (ref 3.5–5.1)
Sodium: 139 mmol/L (ref 135–145)
Total Bilirubin: 0.4 mg/dL (ref 0.3–1.2)
Total Protein: 8 g/dL (ref 6.5–8.1)

## 2021-06-02 LAB — HCG, QUANTITATIVE, PREGNANCY: hCG, Beta Chain, Quant, S: <1 m[IU]/mL (ref <5)

## 2021-06-02 MED ORDER — METOCLOPRAMIDE HCL 5 MG/ML IJ SOLN
10.0000 mg | Freq: Once | INTRAMUSCULAR | Status: AC
  Administered 2021-06-02: 10 mg via INTRAVENOUS
  Filled 2021-06-02: qty 2

## 2021-06-02 MED ORDER — DIAZEPAM 5 MG PO TABS: 5.0000 mg | ORAL_TABLET | Freq: Two times a day (BID) | ORAL | 0 refills | Status: AC

## 2021-06-02 MED ORDER — DIAZEPAM 5 MG/ML IJ SOLN
5.0000 mg | Freq: Once | INTRAMUSCULAR | Status: AC
  Administered 2021-06-02: 5 mg via INTRAVENOUS
  Filled 2021-06-02: qty 2

## 2021-06-02 MED ORDER — SODIUM CHLORIDE 0.9 % IV BOLUS
1000.0000 mL | Freq: Once | INTRAVENOUS | Status: AC
  Administered 2021-06-02: 1000 mL via INTRAVENOUS

## 2021-06-02 MED ORDER — DIPHENHYDRAMINE HCL 50 MG/ML IJ SOLN
12.5000 mg | Freq: Once | INTRAMUSCULAR | Status: AC
  Administered 2021-06-02: 12.5 mg via INTRAVENOUS
  Filled 2021-06-02: qty 1

## 2021-06-02 NOTE — ED Notes (Signed)
Pt reports that her dizziness is a little better than when she came in.

## 2021-06-02 NOTE — ED Notes (Signed)
Pt reports no change in her dizziness after fluids and meds.

## 2021-06-02 NOTE — Discharge Instructions (Addendum)
You were seen in the emergency department today for vertigo.  Your labs look normal here.  We gave you some medications that were not very helpful.  You did have Valium that seem to improve some your symptoms.  I am prescribing you Valium to take over the next couple of days.  Please call your primary care physician regarding your MRI scheduling.  Please go to your neurology appointment in the morning as this will hopefully be more insightful for your ongoing vertigo symptoms.  Please return to the emergency department for severe worsening of your symptoms.

## 2021-06-02 NOTE — ED Provider Notes (Signed)
MEDCENTER [facility] EMERGENCY DEPT Provider Note   CSN: [id number] Arrival date & time: 06/02/21  0901     History  Chief Complaint  Patient presents with   Vertigo   Headache    Rachael Warren is a 41 y.o. female. With no pertinent past medical history who presents to the emergency department with headache.  States that she has had vertigo since Friday.  Patient states that she had an onset of vertigo beginning Friday in the afternoon.  States that she took her propranolol and symptoms went away but came back Saturday and has been persistent since then.  She states that it "feels like a snow globe right now."  She has had nausea with vomiting.  She denies any recent head trauma or falls.  Denies any fevers, syncope, weakness, confusion.  Patient states that she has a long history of vertigo beginning when she was 15.  She states that she had an episode at 47, then an episode last March, and now she is getting monthly episodes at least of vertigo.  In March she was given meclizine and instructed on how to perform Epley maneuvers which she states were not helpful.  She states that at the end of March she then began trying Dramamine which was not helpful.  She went to her PCP last Friday who prescribed her propranolol and sumatriptan for vestibular migraine.  She also saw an ENT that day.  She states that ENT thought that "this was not a inner ear problem" and referred her for an MRI.  She is also been referred to neurology and has an appointment tomorrow morning at 08 30.   HPI     Home Medications Prior to Admission medications   Medication Sig Start Date End Date Taking? Authorizing Provider  ibuprofen (ADVIL,MOTRIN) 800 MG tablet Take 800 mg by mouth every 8 (eight) hours as needed for pain. 09/11/17   [provider]  pantoprazole (PROTONIX) 40 MG tablet Take 40 mg by mouth as needed.     [provider]  Prenatal Vit-Fe Fumarate-FA (PRENATAL MULTIVITAMIN) TABS  tablet Take 1 tablet by mouth daily.     [provider]      Allergies    Patient has no known allergies.    Review of Systems   Review of Systems  Constitutional:  Positive for appetite change. Negative for fever.  Gastrointestinal:  Positive for nausea and vomiting.  Neurological:  Positive for dizziness and headaches. Negative for seizures and syncope.  Psychiatric/Behavioral:  Negative for confusion.   All other systems reviewed and are negative.  Physical Exam Updated Vital Signs BP 129/73 (BP Location: Right Arm)   Pulse 77   Temp 98 F (36.7 C) (Oral)   Resp 18   Ht  (1.651 m)   Wt 86.2 kg   SpO2 100%   BMI 31.62 kg/m  Physical Exam Vitals and nursing note reviewed.  Constitutional:      General: She is not in acute distress.    Appearance: Normal appearance. She is well-developed. She is ill-appearing. She is not toxic-appearing.  HENT:     Head: Normocephalic and atraumatic.     Right Ear: Tympanic membrane normal.     Left Ear: Tympanic membrane normal.     Mouth/Throat:     Mouth: Mucous membranes are moist.     Pharynx: Oropharynx is clear.  Eyes:     General: No visual field deficit or scleral icterus.    Extraocular  Movements: Extraocular movements intact.     Right eye: Nystagmus present.     Left eye: Nystagmus present.     Pupils: Pupils are equal, round, and reactive to light. Pupils are equal.  Cardiovascular:     Rate and Rhythm: Normal rate and regular rhythm.     Pulses: Normal pulses.  Pulmonary:     Effort: Pulmonary effort is normal. No respiratory distress.  Abdominal:     Palpations: Abdomen is soft.  Musculoskeletal:        General: Normal range of motion.     Cervical back: Normal range of motion and neck supple. No rigidity.  Skin:    General: Skin is warm and dry.     Capillary Refill: Capillary refill takes less than 2 seconds.  Neurological:     General: No focal deficit present.     Mental Status: She is alert  and oriented to person, place, and time. Mental status is at baseline.     GCS: GCS eye subscore is 4. GCS verbal subscore is 5. GCS motor subscore is 6.     Cranial Nerves: Cranial nerves 2-12 are intact. No cranial nerve deficit, dysarthria or facial asymmetry.     Sensory: Sensation is intact. No sensory deficit.     Motor: Motor function is intact. No weakness or pronator drift.     Coordination: Coordination is intact. Romberg sign negative.  Psychiatric:        Mood and Affect: Mood normal.        Speech: Speech normal.        Behavior: Behavior normal.        Thought Content: Thought content normal.        Judgment: Judgment normal.    ED Results / Procedures / Treatments   Labs (all labs ordered are listed, but only abnormal results are displayed) Labs Reviewed  COMPREHENSIVE METABOLIC PANEL - Abnormal; Notable for the following components:      Result Value   Glucose, Bld 101 (*)    All other components within normal limits  CBC WITH DIFFERENTIAL/PLATELET  HCG, QUANTITATIVE, PREGNANCY   EKG None  Radiology No results found.  Procedures Procedures   Medications Ordered in ED Medications  sodium chloride 0.9 % bolus 1,000 mL (0 mLs Intravenous Stopped 06/02/21 1104)  metoCLOPramide (REGLAN) injection 10 mg (10 mg Intravenous Given 06/02/21 1005)  diphenhydrAMINE (BENADRYL) injection 12.5 mg (12.5 mg Intravenous Given 06/02/21 1004)  diazepam (VALIUM) injection 5 mg (5 mg Intravenous Given 06/02/21 1153)    ED Course/ Medical Decision Making/ A&P Clinical Course as of 06/02/21 1255  Mon Jun 02, 2021  1020 This is a 41 year old female presenting to ED with persistent episodes of headaches and vertigo.  This been ongoing for several months, she reports she will get attacks of vertigo, lasting anywhere from 1 to 3 days, which are sometimes associated with a posterior headache, but not always.  She has seen an ENT specialist who felt this is more likely a neurological  issue, advised that she see a neurologist, she has an appointment tomorrow with neurology.  Her PCP is also ordered her an MRI of the brain.  She comes to the ER today because she has been having persistent vertigo for about 2 to 3 days, also with a mild posterior headache.  She overall has a reassuring neurological exam, she does have unilateral horizontal nystagmus with leftward gaze.  The remainder of her neurological exam is benign.  I have  a low suspicion for meningitis or bacterial infection.  I do not think a CT scan of the brain would be useful at this point, but I do agree an outpatient MRI would be reasonable.  I think this can be done as an outpatient given that she has been having waxing and waning, on and off symptoms for months, which lowers my suspicion for stroke (she also has no acute stroke risk factors).  No history of neck trauma or posterior cervical pain to suggest vascular injury.  We will treat her for possible complex migraine versus vestibular migraine, given the association of headache with her symptoms, give her some fluids and a migraine cocktail.  She already has's Imitrex, meclizine and propanolol at home, and I think specialist follow-up tomorrow is reasonable.  The patient is in agreement [MT]    Clinical Course User Index [MT] Trifan, Kermit Balo, MD                           Medical Decision Making Amount and/or Complexity of Data Reviewed Labs: ordered.  Risk Prescription drug management.  This patient presents to the ED for concern of vertigo, this involves an extensive number of treatment options, and is a complaint that carries with it a high risk of complications and morbidity.  The differential diagnosis includes BPPV, trauma, labyrinthitis, vestibular neuritis, meniere's, cerebellar stroke,vetebrobasilar insufficiency, vertebral artery dissection, MS, migraine, etc.   Co morbidities that complicate the patient evaluation Migraine   Additional history  obtained:  Additional history obtained from: family at bedside  External records from outside source obtained and reviewed including: ENT physician note   Lab Results: I personally ordered, reviewed, and interpreted labs. Pertinent results include: CBC within normal limits CMP within normal limits hCG <1  Medications  I ordered medication including fluids, benadryl, reglan for vertigo, initially did not improve symptoms. Ordered valium which improved symptoms  Reevaluation of the patient after medication shows that patient improved -I reviewed the patient's home medications and did not make adjustments. -I did  prescribe new home medications.  Tests Considered: CT, MRI. MRI is ordered outpatient, feel this is reasonable. Patient states she does not want CT at this time.   SDH None identified   ED Course:  41 year old female who presents emergency department with vertigo.  Her physical exam is notable for unilateral left-sided nystagmus.  Otherwise is unremarkable.  She has no red flag vertigo symptoms such as bidirectional, vertical, not fatigable nystagmus. Her HINTS exam is reassuring. No hx of trauma. No infectious symptoms.   Labs unremarkable. She has schedule OP MRI and defers CT imaging here today. I do think this is reasonable given her intermittent, long standing symptoms.  No hearing loss concerning for Meniere's, labyrinthitis  No trauma concerning for hemorrhage  No sudden onset, constant dizziness concerning for vetebral artery dissection, cerebellar stroke. Less likely stroke given intermittent symptoms over months.  No infectious symptoms like fever, or neck stiffness concerning for meningitis or CNS infection. No neck trauma concerning for dissection.  Likely vestibular vs. Complex mirgraine. I ordered fluids, Reglan and Benadryl initially without improvement of her symptoms.  I then ordered Valium which provided her with mild to moderate relief.  We will prescribe  her 2 or 3 doses of Valium for home.  She has a neurology appointment tomorrow morning.  Additionally she has outpatient MRI which I think is appropriate.  She is given return precautions for any worsening symptoms  or infectious symptoms.  She verbalized understanding.  Feel that she is otherwise safe for discharge at this time.  I discussed this case with my attending physician who cosigned this note including patient's presenting symptoms, physical exam, and planned diagnostics and interventions. Attending physician stated agreement with plan or made changes to plan which were implemented.   Attending physician assessed patient at bedside.   After consideration of the diagnostic results and the patients response to treatment, I feel that the patent would benefit from discharge. The patient has been appropriately medically screened and/or stabilized in the ED. I have low suspicion for any other emergent medical condition which would require further screening, evaluation or treatment in the ED or require inpatient management. The patient is overall well appearing and non-toxic in appearance. They are hemodynamically stable at time of discharge.   Final Clinical Impression(s) / ED Diagnoses Final diagnoses:  Vertigo    Rx / DC Orders ED Discharge Orders          Ordered    diazepam (VALIUM) 5 MG tablet  2 times daily        06/02/21 1245              Cristopher Peruutry, Catie Chiao E, PA-C 06/02/21 1303    Terald Sleeperrifan, Matthew J, MD 06/02/21 [phone]

## 2021-06-02 NOTE — ED Notes (Signed)
Pt discharged home after verbalizing understanding of discharge instructions; nad noted. 

## 2021-06-02 NOTE — ED Triage Notes (Signed)
Pt arrives to ED with c/o vertigo and headache.This started on 5/27 and has been constant. This is a chronic recurrent problem. Pt is not currently taking meclizine but is taking Imitrex, dramamine, and propanolol. Pt has appointment with Neuro tomorrow and a ordered MRI. Recently seen ENT last week.

## 2021-06-03 ENCOUNTER — Encounter: Payer: Self-pay | Admitting: Neurology

## 2021-06-03 ENCOUNTER — Ambulatory Visit (INDEPENDENT_AMBULATORY_CARE_PROVIDER_SITE_OTHER): Payer: Commercial Managed Care - PPO | Admitting: Neurology

## 2021-06-03 VITALS — BP 114/74 | HR 73 | Ht 65.0 in | Wt 205.0 lb

## 2021-06-03 DIAGNOSIS — G43709 Chronic migraine without aura, not intractable, without status migrainosus: Secondary | ICD-10-CM | POA: Diagnosis not present

## 2021-06-03 MED ORDER — ONDANSETRON HCL 4 MG PO TABS: 4.0000 mg | ORAL_TABLET | Freq: Three times a day (TID) | ORAL | 6 refills | Status: DC | PRN

## 2021-06-03 MED ORDER — TIZANIDINE HCL 4 MG PO TABS: 4.0000 mg | ORAL_TABLET | Freq: Four times a day (QID) | ORAL | 6 refills | Status: DC | PRN

## 2021-06-03 MED ORDER — RIZATRIPTAN BENZOATE 10 MG PO TBDP: 10.0000 mg | ORAL_TABLET | ORAL | 11 refills | Status: DC | PRN

## 2021-06-03 MED ORDER — NORTRIPTYLINE HCL 10 MG PO CAPS: 30.0000 mg | ORAL_CAPSULE | Freq: Every day | ORAL | 11 refills | Status: DC

## 2021-06-03 NOTE — Progress Notes (Unsigned)
Chief Complaint  Patient presents with   New Patient (Initial Visit)    Room 15 - alone. Recent diagnosis of migraines associated with dizziness, occasional vision disturbance, nausea. Started propranolol 60mg , one tab daily for prevention on 05/23/21. Tried sumatriptan three times with not much benefit. Hx of vertigo but meclizine and the Epley maneuver resolved the issue.       ASSESSMENT AND PLAN  Rachael Warren is a 41 y.o. female    Chronic migraine headaches  Normal neurological examinations  MRI of the brain was ordered by primary care physician, pending  Reported normal laboratory evaluation including CMP, CBC, TSH  Start preventive medication nortriptyline, titrating up to 30 mg every night  Suboptimal response to Imitrex 50 mg as needed,  Will try Maxalt 10 mg dissolvable as needed, may combine with Aleve, Zofran, tizanidine for prolonged severe headaches/dizziness  Return to clinic in 3 months with nurse practitioner   DIAGNOSTIC DATA (LABS, IMAGING, TESTING) - I reviewed patient records, labs, notes, testing and imaging myself where available.  Jun 02, 2021, negative hCG, CBC, CMP MEDICAL HISTORY:  Rachael Warren, seen in request by   Rachael Smoker, MD [place],  [place] [address], Rachael Smoker, MD   I reviewed and summarized the referring note. PMHX. Chronic migraine.   Inderal LA for one wee,   Imitrex 50mg  tried May 28th, 26,   Very painful, nause, movement make it worse, lyding down,  HA, dizizness lasted for 2-3 days now,   Dizziness, unsteasy, eey closed dentist.  No lateralized,   Cloudy, when she has dizziness, she does nto have aura,   Imitrex, May 28th, HA went aawy, seh fell into sleep,   Dizziness for nno headaches.     Migriane May 19th, shooting pain, sharp, lateralized headaches,   March 2022, dizziness spell, eagle, mecalzine, flucze, epilpey [place],   IN jan 23 dizziness, throw up for one  hour, no headaches.  Feb, 2023, again, dizziness, seppll, mecazline, epilizy  April 04 2021, dizziness spell for 3 days, after it went saway, worst headaches,   April, was not as bad.  May 10th at work, she vertigous migraine, proanaol, imitrex,   Missed diaganised not always has head, imirex, resculre, diabiality without headahces, resiculre,   ER, valium, ER did nto help.    PHYSICAL EXAM:   Vitals:   06/03/21 0840  BP: 114/74  Pulse: 73  Weight: 205 lb (93 kg)  Height: 5\' 5"  (1.651 m)   Not recorded     Body mass index is 34.11 kg/m.  PHYSICAL EXAMNIATION:  Gen: NAD, conversant, well nourised, well groomed                     Cardiovascular: Regular rate rhythm, no peripheral edema, warm, nontender. Eyes: Conjunctivae clear without exudates or hemorrhage Neck: Supple, no carotid bruits. Pulmonary: Clear to auscultation bilaterally   NEUROLOGICAL EXAM:  MENTAL STATUS: Speech/cognition: Anxious looking middle-age female, awake, alert, oriented to history taking and casual conversation CRANIAL NERVES: CN II: Visual fields are full to confrontation. Pupils are round equal and briskly reactive to light.  Funduscopy examination was normal, sharp disc edge CN III, IV, VI: extraocular movement are normal. No ptosis. CN V: Facial sensation is intact to light touch CN VII: Face is symmetric with normal eye closure  CN VIII: Hearing is normal to causal conversation. CN IX, X: Phonation is normal. CN XI: Head turning and shoulder shrug  are intact  MOTOR: There is no pronator drift of out-stretched arms. Muscle bulk and tone are normal. Muscle strength is normal.  REFLEXES: Reflexes are 2+ and symmetric at the biceps, triceps, knees, and ankles. Plantar responses are flexor.  SENSORY: Intact to light touch, pinprick and vibratory sensation are intact in fingers and toes.  COORDINATION: There is no trunk or limb dysmetria noted.  GAIT/STANCE: Posture is normal.  Gait is steady with normal steps, base, arm swing, and turning. Heel and toe walking are normal. Tandem gait is normal.  Romberg is absent.  REVIEW OF SYSTEMS:  Full 14 system review of systems performed and notable only for as above All other review of systems were negative.   ALLERGIES: No Known Allergies  HOME MEDICATIONS: Current Outpatient Medications  Medication Sig Dispense Refill   diazepam (VALIUM) 5 MG tablet Take 1 tablet (5 mg total) by mouth 2 (two) times daily for 3 days. 6 tablet 0   dimenhyDRINATE (DRAMAMINE PO) Take by mouth as needed.     ibuprofen (ADVIL,MOTRIN) 800 MG tablet Take 800 mg by mouth every 8 (eight) hours as needed for pain.  0   propranolol ER (INDERAL LA) 60 MG 24 hr capsule Take 60 mg by mouth daily.     SUMAtriptan (IMITREX) 50 MG tablet Take 50 mg by mouth 2 (two) times daily as needed.     No current facility-administered medications for this visit.    PAST MEDICAL HISTORY: Past Medical History:  Diagnosis Date   Medical history non-contributory    Postpartum care following vaginal delivery (10/25) 10/29/2013   SVD (spontaneous vaginal delivery)    x 2   Vertigo     PAST SURGICAL HISTORY: Past Surgical History:  Procedure Laterality Date   DILATION AND CURETTAGE OF UTERUS  10/04/2017   in office for displaced iud    HYSTEROSCOPY WITH D & C N/A 11/29/2017   Procedure: DILATATION AND CURETTAGE /HYSTEROSCOPY;  Surgeon: Servando Salina, MD;  Location: [facility];  Service: Gynecology;  Laterality: N/A;   LAPAROSCOPY N/A 11/29/2017   Procedure: LAPAROSCOPY DIAGNOSTIC/Removal of IUD;  Surgeon: Servando Salina, MD;  Location: [facility];  Service: Gynecology;  Laterality: N/A;   TONSILLECTOMY     TONSILLECTOMY  age 75   WISDOM TOOTH EXTRACTION      FAMILY HISTORY: Family History  Problem Relation Age of Onset   Diabetes Paternal Grandmother    Heart disease Paternal Grandfather    Heart attack Paternal Grandfather    Brain cancer  Maternal Aunt    Healthy Mother    Healthy Father     SOCIAL HISTORY: Social History   Socioeconomic History   Marital status: Significant Other    Spouse name: Not on file   Number of children: 2   Years of education: Doctorate   Highest education level: Not on file  Occupational History   Occupation: Dentist  Tobacco Use   Smoking status: Never   Smokeless tobacco: Never  Vaping Use   Vaping Use: Never used  Substance and Sexual Activity   Alcohol use: Yes    Comment: 1-2 drinks per month   Drug use: No   Sexual activity: Not on file    Comment: Mirena  Other Topics Concern   Not on file  Social History Narrative   Lives at home with significant other.   Right-handed.   No daily caffeine use.   Social Determinants of Health   Financial Resource Strain: Not on file  Food  Insecurity: Not on file  Transportation Needs: Not on file  Physical Activity: Not on file  Stress: Not on file  Social Connections: Not on file  Intimate Partner Violence: Not on file      Marcial Pacas, M.D. Ph.D.  [facility] [address], [place], [place] [address] Ph: [phone] Fax: [phone]  CC:  Rachael Smoker, MD [place],  [place] [address]  Rachael Smoker, MD  -------------------------------------

## 2021-06-05 ENCOUNTER — Telehealth: Payer: Self-pay | Admitting: Neurology

## 2021-06-05 DIAGNOSIS — G43709 Chronic migraine without aura, not intractable, without status migrainosus: Secondary | ICD-10-CM

## 2021-06-05 DIAGNOSIS — R9089 Other abnormal findings on diagnostic imaging of central nervous system: Secondary | ICD-10-CM

## 2021-06-05 NOTE — Telephone Encounter (Signed)
She has only tried nortriptyline 10mg , one capsule at bedtime for two nights. Encouraged her to try the medication for a little longer. Typically, the side effects will subside once her body becomes acclimated to the new drug. She may titrate as tolerated. If symptoms continue to be an issue, she will call our office back for further instruction.

## 2021-06-05 NOTE — Telephone Encounter (Signed)
Pt said, experiencing side effect from nortriptyline (PAMELOR) 10 MG capsule. Having some dizziness, can not focus, feeling a little off. Should I take early or discontinue or is this normal. Would like a call from the nurse.

## 2021-06-08 ENCOUNTER — Ambulatory Visit
Admission: RE | Admit: 2021-06-08 | Discharge: 2021-06-08 | Disposition: A | Discharge status: Home / Self Care | Payer: Commercial Managed Care - PPO | Source: Ambulatory Visit | Attending: Family Medicine | Admitting: Family Medicine

## 2021-06-08 DIAGNOSIS — G43109 Migraine with aura, not intractable, without status migrainosus: Secondary | ICD-10-CM

## 2021-06-10 NOTE — Telephone Encounter (Signed)
Pt still experiencing disoriented, blurred vision, dizziness with the nortriptyline.   Is not taken the Rizatriptan because have not had a severe headache. Have only taken one, but did not do anything for the headache.  Also have issues with turning my head to the right.  MRI results are in MyChart, want to make sure Dr. Terrace Arabia can see the results. Would like a call back from the nurse.

## 2021-06-10 NOTE — Telephone Encounter (Signed)
She works as a Education officer, community. Needs to discuss meds w/ Dr. Terrace Arabia.   She tried nortriptyline 10mg  at bedtime for the last week. Not able to tolerate the medication, even at low dose. Makes her feel disoriented with intolerable dizziness.   Also, she stopped using the NuvaRing in February. She would like to be on birth control. Not sure what she should be using with migraine diagnosis.  Lastly, when she turns her head to the right, it feels like a magnet is pulling it back further. This has been an issue since her migraine over Memorial Day weekend.  __________________________________ MRI brain ordered by PCP. Scan completed 06/08/21. Say her PCP instructed her to call here to discuss findings.   IMPRESSION: Normal appearance of the brain itself.   Abnormal T1 and T2 signal material in the petrous apex on the right, most consistent with trapped fluid. Cholesterol granuloma not excluded but not favored. This can be associated with headache.

## 2021-06-11 DIAGNOSIS — R9089 Other abnormal findings on diagnostic imaging of central nervous system: Secondary | ICD-10-CM | POA: Insufficient documentation

## 2021-06-11 NOTE — Telephone Encounter (Signed)
I have talked with patient, she complains of right-sided discomfort, feel dizzy when lying on her right side, but no vertigo, also complains of difficulty focusing from morning until 3 PM, confused slow reaction time in the afternoon  MRI of the brain without contrast June 09, 2021 normal appearance of the brain itself.   Abnormal T1 and T2 signal material in the petrous apex on the right, most consistent with trapped fluid. Cholesterol granuloma not excluded but not favored. This can be associated with headache.   She was evaluated by ENT not long ago, reported normal examination, will proceed with MRI of the brain with without contrast through internal acoustic canal  Advised her stop nortriptyline for at least 1 week to see if her current symptoms due to side effect of nortriptyline or her underlying disease  She may restart nortriptyline or  Inderal as migraine prevention later

## 2021-06-11 NOTE — Addendum Note (Signed)
Addended by: Levert Feinstein on: 06/11/2021 08:32 AM   Modules accepted: Orders

## 2021-06-25 ENCOUNTER — Other Ambulatory Visit: Payer: Self-pay | Admitting: Neurology

## 2021-09-02 NOTE — Progress Notes (Unsigned)
[facility] [address] [place]. [place] [address] [phone]       OFFICE FOLLOW UP NOTE  [name] Date of Birth:  1980/03/10 Medical Record Number:  [id number]   Reason for visit:     SUBJECTIVE:   CHIEF COMPLAINT:  No chief complaint on file.   HPI:    Rachael Warren is a 41 year old female, seen in request by her primary care physician Dr. Shon Hale, for evaluation of dizziness, headache, initial evaluation was on Jun 03, 2021 with Dr. Terrace Arabia   I reviewed and summarized the referring note. PMHX. Chronic migraine.   Consult visit 06/03/2021 Dr. 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, tends to feel anxious, not sleeping well   In January 2023, she had episode of vertigo, lasting for 1 hour, nausea vomiting, but no headaches,  In February 2023, she had another episode of vertigo, mild headache   In March 2023, she had her worst headache, lateralized severe pounding, with vertigo, dizziness, lasting for 3 days, eventually went away,   She was seen by ENT, no abnormality found,  She also has minor similar episode in April, and May, actually was at emergency room on Jun 02, 2021, persistent headaches, vertigo for 2 to 3 days, headache, located at occipital region, radiating towards the shoulder,  Laboratory showed normal CBC, CMP  She was given Valium, Reglan, Benadryl, make her feel lightheaded, sleepy, tired She was told to have migraine variant, started on propanolol, just a few days ago, Imitrex 50 mg as needed    Update 09/03/2021 JM: Patient returns for 39-month migraine follow-up.  Initiated nortriptyline but complained of dizziness, difficulty focusing and "feeling a little off".  MRI brain showed likely  trapped fluid at petrous apex on the right and recommended proceeding with MRI brain with and without contrast through [facility] but not yet completed.  Advised to hold nortriptyline for at least 1 week to see if symptoms resolved ***.        ROS:   14 system review of systems performed and negative with exception of ***  PMH:  Past Medical History:  Diagnosis Date   Medical history non-contributory    Postpartum care following vaginal delivery (10/25) 10/29/2013   SVD (spontaneous vaginal delivery)    x 2   Vertigo     PSH:  Past Surgical History:  Procedure Laterality Date   DILATION AND CURETTAGE OF UTERUS  10/04/2017   in office for displaced iud    HYSTEROSCOPY WITH D & C N/A 11/29/2017   Procedure: DILATATION AND CURETTAGE /HYSTEROSCOPY;  Surgeon: Maxie Better, MD;  Location: [facility];  Service: Gynecology;  Laterality: N/A;   LAPAROSCOPY N/A 11/29/2017   Procedure: LAPAROSCOPY DIAGNOSTIC/Removal of IUD;  Surgeon: Maxie Better, MD;  Location: [facility];  Service: Gynecology;  Laterality: N/A;   TONSILLECTOMY     TONSILLECTOMY  age 65   WISDOM TOOTH EXTRACTION      Social History:  Social History   Socioeconomic History   Marital status: Significant Other    Spouse name: Not on file   Number of children: 2   Years of education: Doctorate   Highest education level: Not on file  Occupational History   Occupation: Dentist  Tobacco Use  Smoking status: Never   Smokeless tobacco: Never  Vaping Use   Vaping Use: Never used  Substance and Sexual Activity   Alcohol use: Yes    Comment: 1-2 drinks per month   Drug use: No   Sexual activity: Not on file    Comment: Mirena  Other Topics Concern   Not on file  Social History Narrative   Lives at home with significant other.   Right-handed.   No daily caffeine use.   Social Determinants of Health   Financial Resource Strain: Not on file  Food Insecurity: Not on file  Transportation Needs: Not on file   Physical Activity: Not on file  Stress: Not on file  Social Connections: Not on file  Intimate Partner Violence: Not on file    Family History:  Family History  Problem Relation Age of Onset   Diabetes Paternal Grandmother    Heart disease Paternal Grandfather    Heart attack Paternal Grandfather    Brain cancer Maternal Aunt    Healthy Mother    Healthy Father     Medications:   Current Outpatient Medications on File Prior to Visit  Medication Sig Dispense Refill   dimenhyDRINATE (DRAMAMINE PO) Take by mouth as needed.     ibuprofen (ADVIL,MOTRIN) 800 MG tablet Take 800 mg by mouth every 8 (eight) hours as needed for pain.  0   nortriptyline (PAMELOR) 10 MG capsule Take 3 capsules (30 mg total) by mouth at bedtime. 90 capsule 11   ondansetron (ZOFRAN) 4 MG tablet Take 1 tablet (4 mg total) by mouth every 8 (eight) hours as needed for nausea or vomiting. 20 tablet 6   propranolol ER (INDERAL LA) 60 MG 24 hr capsule Take 60 mg by mouth daily.     rizatriptan (MAXALT-MLT) 10 MG disintegrating tablet Take 1 tablet (10 mg total) by mouth as needed. May repeat in 2 hours if needed 12 tablet 11   SUMAtriptan (IMITREX) 50 MG tablet Take 50 mg by mouth 2 (two) times daily as needed.     tiZANidine (ZANAFLEX) 4 MG tablet Take 1 tablet (4 mg total) by mouth every 6 (six) hours as needed for muscle spasms. 30 tablet 6   No current facility-administered medications on file prior to visit.    Allergies:  No Known Allergies    OBJECTIVE:  Physical Exam  There were no vitals filed for this visit. There is no height or weight on file to calculate BMI. No results found.   General: well developed, well nourished, seated, in no evident distress Head: head normocephalic and atraumatic.   Neck: supple with no carotid or supraclavicular bruits Cardiovascular: regular rate and rhythm, no murmurs Musculoskeletal: no deformity Skin:  no rash/petichiae Vascular:  Normal pulses all  extremities   Neurologic Exam Mental Status: Awake and fully alert. Oriented to place and time. Recent and remote memory intact. Attention span, concentration and fund of knowledge appropriate. Mood and affect appropriate.  Cranial Nerves: Pupils equal, briskly reactive to light. Extraocular movements full without nystagmus. Visual fields full to confrontation. Hearing intact. Facial sensation intact. Face, tongue, palate moves normally and symmetrically.  Motor: Normal bulk and tone. Normal strength in all tested extremity muscles Sensory.: intact to touch , pinprick , position and vibratory sensation.  Coordination: Rapid alternating movements normal in all extremities. Finger-to-nose and heel-to-shin performed accurately bilaterally. Gait and Station: Arises from chair without difficulty. Stance is normal. Gait demonstrates normal stride length and balance without use of AD.  Tandem walk and heel toe without difficulty.  Reflexes: 1+ and symmetric. Toes downgoing.     DIAGNOSTIC DATA   MRI BRAIN WO CONTRAST 06/08/2021 IMPRESSION: Normal appearance of the brain itself.   Abnormal T1 and T2 signal material in the petrous apex on the right, most consistent with trapped fluid. Cholesterol granuloma not excluded but not favored. This can be associated with headache.     ASSESSMENT/PLAN: Rachael Warren is a 41 y.o. year old female   Chronic migraine headaches             Normal neurological examinations             MRI of the brain showed normal appearance of brain but abnormal T1 and T2 signal material petrous apex on the right most consistent with trapped fluid  Advised to contact [facility] imaging to schedule MR brain/IAC as previously ordered by Dr. Krista Blue             Reported normal laboratory evaluation including CMP, CBC, TSH   Previously tried/failed: Propranolol, nortriptyline, sumatriptan, rizatriptan             Start preventive medication nortriptyline, titrating up to 30 mg  every night             Suboptimal response to Imitrex 50 mg as needed,             Will try Maxalt 10 mg dissolvable as needed, may combine with Aleve, Zofran, tizanidine for prolonged severe headaches/dizziness             Return to clinic in 3 months with nurse practitioner        Follow up in *** or call earlier if needed   CC:  PCP: Glenis Smoker, MD    I spent *** minutes of face-to-face and non-face-to-face time with patient.  This included previsit chart review, lab review, study review, order entry, electronic health record documentation, patient education regarding ***   Frann Rider, AGNP-BC  [facility] [address] [place], [place] [address]  Phone [phone] Fax [phone] Note: This document was prepared with digital dictation and possible smart phrase technology. Any transcriptional errors that result from this process are unintentional.

## 2021-09-03 ENCOUNTER — Ambulatory Visit (INDEPENDENT_AMBULATORY_CARE_PROVIDER_SITE_OTHER): Payer: Commercial Managed Care - PPO | Admitting: Adult Health

## 2021-09-03 ENCOUNTER — Encounter: Payer: Self-pay | Admitting: Adult Health

## 2021-09-03 VITALS — BP 133/54 | HR 78 | Ht 65.0 in | Wt 205.0 lb

## 2021-09-03 DIAGNOSIS — G43709 Chronic migraine without aura, not intractable, without status migrainosus: Secondary | ICD-10-CM

## 2021-09-03 NOTE — Patient Instructions (Addendum)
Please continue current treatment regimen     Follow up in 6 months or call earlier if needed

## 2022-03-05 ENCOUNTER — Ambulatory Visit: Payer: Commercial Managed Care - PPO | Admitting: Adult Health

## 2022-07-22 NOTE — Progress Notes (Signed)
[facility] [address] [place]. [place] [address] [phone]       OFFICE FOLLOW UP NOTE  Ms. [name] Date of Birth:  1980-07-10 Medical Record Number:  [id number]   Primary neurologist: Dr. Terrace Arabia Reason for visit: Migraine headaches    SUBJECTIVE:   CHIEF COMPLAINT:  Chief Complaint  Patient presents with   Follow-up    Rm 3, here alone Pt is here for follow up on migraines. Pt states last migraine about 6 months ago. Pt states she is doing well overall.     HPI:   Update 07/23/2022 JM: Returns for routine follow-up visit.  Reports she has been doing well.  Has experienced 1 migraine headache in the past 6 months.  Use of rizatriptan and tizanidine with benefit.  Has not needed Zofran recently, has used previously when migraine associated with dizziness. She did come off birth control back in October which she believes has helped, husband underwent vasectomy.  No questions or concerns at this time.    History provided for reference purposes only Update 09/03/2021 JM: Patient returns for 30-month migraine follow-up.  Initiated nortriptyline but complained of dizziness, difficulty focusing and "feeling a little off".  MRI brain showed likely trapped fluid at petrous apex on the right and recommended proceeding with MRI brain with and without contrast through [facility] but not yet completed.  Advised to hold nortriptyline for at least 1 week, symptoms resolved and did not restart. Reports she has had 1 migraine headache about 2 weeks after prior visit, resolved after taking rizatriptan, Zofran and tizanidine.  She is not currently on prophylactic therapy.  She also had her NuvaRing replaced about 3 weeks ago, her partner is scheduled to undergo vasectomy this week and she plans on removing NuvaRing once able.  No further concerns at this time   Consult visit 06/03/2021 Dr. Terrace Arabia: CORINN LARICCIA is a 42 year old female, seen in request by her primary care  physician Dr. Shon Hale, for evaluation of dizziness, headache, initial evaluation was on Jun 03, 2021 with Dr. Terrace Arabia   I reviewed and summarized the referring note. PMHX. Chronic migraine.    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, tends to feel anxious, not sleeping well   In January 2023, she had episode of vertigo, lasting for 1 hour, nausea vomiting, but no headaches,  In February 2023, she had another episode of vertigo, mild headache   In March 2023, she had her worst headache, lateralized severe pounding, with vertigo, dizziness, lasting for 3 days, eventually went away,   She was seen by ENT, no abnormality found,  She also has minor similar episode in April, and May, actually was at emergency room on Jun 02, 2021, persistent headaches, vertigo for 2 to 3 days, headache, located at occipital region, radiating towards the shoulder,  Laboratory showed normal CBC, CMP  She was given Valium, Reglan, Benadryl, make her feel lightheaded, sleepy, tired She was told to have migraine variant, started on propanolol, just a few days ago, Imitrex 50 mg as needed         ROS:   14 system review of systems performed and negative with exception of those listed in HPI  PMH:  Past Medical History:  Diagnosis Date   Medical  history non-contributory    Postpartum care following vaginal delivery (10/25) 10/29/2013   SVD (spontaneous vaginal delivery)    x 2   Vertigo     PSH:  Past Surgical History:  Procedure Laterality Date   DILATION AND CURETTAGE OF UTERUS  10/04/2017   in office for displaced iud    HYSTEROSCOPY WITH D & C N/A 11/29/2017   Procedure: DILATATION AND CURETTAGE /HYSTEROSCOPY;  Surgeon: Maxie Better, MD;  Location: [facility];  Service:  Gynecology;  Laterality: N/A;   LAPAROSCOPY N/A 11/29/2017   Procedure: LAPAROSCOPY DIAGNOSTIC/Removal of IUD;  Surgeon: Maxie Better, MD;  Location: [facility];  Service: Gynecology;  Laterality: N/A;   TONSILLECTOMY     TONSILLECTOMY  age 69   WISDOM TOOTH EXTRACTION      Social History:  Social History   Socioeconomic History   Marital status: Significant Other    Spouse name: Not on file   Number of children: 2   Years of education: Doctorate   Highest education level: Not on file  Occupational History   Occupation: Dentist  Tobacco Use   Smoking status: Never   Smokeless tobacco: Never  Vaping Use   Vaping status: Never Used  Substance and Sexual Activity   Alcohol use: Yes    Comment: 1-2 drinks per month   Drug use: No   Sexual activity: Not on file    Comment: Mirena  Other Topics Concern   Not on file  Social History Narrative   Lives at home with significant other.   Right-handed.   No daily caffeine use.   Social Determinants of Health   Financial Resource Strain: Not on file  Food Insecurity: Not on file  Transportation Needs: Not on file  Physical Activity: Not on file  Stress: Not on file  Social Connections: Not on file  Intimate Partner Violence: Not on file    Family History:  Family History  Problem Relation Age of Onset   Diabetes Paternal Grandmother    Heart disease Paternal Grandfather    Heart attack Paternal Grandfather    Brain cancer Maternal Aunt    Healthy Mother    Healthy Father     Medications:   Current Outpatient Medications on File Prior to Visit  Medication Sig Dispense Refill   ibuprofen (ADVIL,MOTRIN) 800 MG tablet Take 800 mg by mouth as needed for pain. (Patient not taking: Reported on 07/23/2022)  0   ondansetron (ZOFRAN) 4 MG tablet Take 1 tablet (4 mg total) by mouth every 8 (eight) hours as needed for nausea or vomiting. (Patient not taking: Reported on 07/23/2022) 20 tablet 6   rizatriptan (MAXALT-MLT) 10 MG  disintegrating tablet Take 1 tablet (10 mg total) by mouth as needed. May repeat in 2 hours if needed 12 tablet 11   tiZANidine (ZANAFLEX) 4 MG tablet Take 1 tablet (4 mg total) by mouth every 6 (six) hours as needed for muscle spasms. 30 tablet 6   No current facility-administered medications on file prior to visit.    Allergies:  No Known Allergies    OBJECTIVE:  Physical Exam  Vitals:   07/23/22 0841  BP: 116/72  Pulse: 70   There is no height or weight on file to calculate BMI. No results found.   General: well developed, well nourished, very pleasant middle-age African-American female, seated, in no evident distress Head: head normocephalic and atraumatic.   Neck: supple with no carotid or supraclavicular bruits Cardiovascular: regular rate and rhythm, no murmurs  Musculoskeletal: no deformity Skin:  no rash/petichiae Vascular:  Normal pulses all extremities   Neurologic Exam Mental Status: Awake and fully alert. Oriented to place and time. Recent and remote memory intact. Attention span, concentration and fund of knowledge appropriate. Mood and affect appropriate.  Cranial Nerves: Pupils equal, briskly reactive to light. Extraocular movements full without nystagmus. Visual fields full to confrontation. Hearing intact. Facial sensation intact. Face, tongue, palate moves normally and symmetrically.  Motor: Normal bulk and tone. Normal strength in all tested extremity muscles Sensory.: intact to touch , pinprick , position and vibratory sensation.  Coordination: Rapid alternating movements normal in all extremities. Finger-to-nose and heel-to-shin performed accurately bilaterally. Gait and Station: Arises from chair without difficulty. Stance is normal. Gait demonstrates normal stride length and balance without use of AD.  Reflexes: 1+ and symmetric. Toes downgoing.     DIAGNOSTIC DATA   MRI BRAIN WO CONTRAST 06/08/2021 IMPRESSION: Normal appearance of the brain  itself.   Abnormal T1 and T2 signal material in the petrous apex on the right, most consistent with trapped fluid. Cholesterol granuloma not excluded but not favored. This can be associated with headache.      ASSESSMENT/PLAN: IVONA COOKSEY is a 42 y.o. year old female   Chronic migraine headaches Continue rizatriptan and tizanidine as needed for migraine headaches   Use of Zofran for migraine associated dizziness or nausea       No indication for preventative therapy at this time     Normal neurological examination             MRI of the brain showed normal appearance of brain but abnormal T1 and T2 signal material petrous apex on the right most consistent with trapped fluid  If headache should restart, recommend proceeding with MRI brain/IAC w wo contrast as previously ordered by Dr. Terrace Arabia             Reported normal laboratory evaluation including CMP, CBC, TSH   Previously tried/failed: Propranolol, nortriptyline (side effects), sumatriptan, rizatriptan    Follow up in 1 year or call earlier if needed   CC:  PCP: Shon Hale, MD    I spent 20 minutes of face-to-face and non-face-to-face time with patient.  This included previsit chart review, lab review, study review, order entry, electronic health record documentation, patient education and discussion regarding above diagnoses and treatment plan and answered all other questions to patient satisfaction   Ihor Austin, [facility]  [facility] [address] [place], [place] [address]  Phone [phone] Fax [phone] Note: This document was prepared with digital dictation and possible smart phrase technology. Any transcriptional errors that result from this process are unintentional.

## 2022-07-23 ENCOUNTER — Encounter: Payer: Self-pay | Admitting: Adult Health

## 2022-07-23 ENCOUNTER — Ambulatory Visit: Payer: Commercial Managed Care - PPO | Admitting: Adult Health

## 2022-07-23 VITALS — BP 116/72 | HR 70

## 2022-07-23 DIAGNOSIS — G43709 Chronic migraine without aura, not intractable, without status migrainosus: Secondary | ICD-10-CM | POA: Diagnosis not present

## 2022-07-23 MED ORDER — TIZANIDINE HCL 4 MG PO TABS: 4.0000 mg | ORAL_TABLET | Freq: Four times a day (QID) | ORAL | 6 refills | Status: DC | PRN

## 2022-07-23 MED ORDER — RIZATRIPTAN BENZOATE 10 MG PO TBDP: 10.0000 mg | ORAL_TABLET | ORAL | 11 refills | Status: DC | PRN

## 2022-07-23 NOTE — Patient Instructions (Addendum)
Your Plan:  Continue rizatriptan and tizanidine as needed  Please call with any worsening migraine headaches or no benefit with use of rizatriptan and tizanidine combo     Follow up in 1 year or call earlier if needed    Thank you for coming to see [place] at [facility]. I hope we have been able to provide you high quality care today.  You may receive a patient satisfaction survey over the next few weeks. We would appreciate your feedback and comments so that we may continue to improve ourselves and the health of our patients.

## 2023-03-17 IMAGING — MR MR HEAD W/O CM
13 series · 48 of 48 positions shown · non-contrast
Comparison: None

CLINICAL DATA: Migraine headache. Vertigo. Symptoms over the last 2
years. Gait disturbance.

EXAM:
MRI HEAD WITHOUT CONTRAST
TECHNIQUE: Multiplanar, multiecho pulse sequences of the brain and surrounding
structures were obtained without intravenous contrast.

[Series 2: T1 · sagittal · 5.0mm · 0.45mm/px · 1 of 21 slices shown (1 of 3)]
[im 1/21]
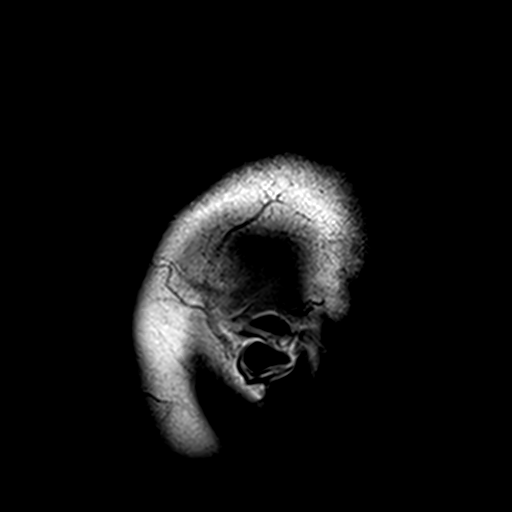

[Series 3: DWI · axial · 3.0mm · 1.80mm/px · z∈[-106,+40]mm · 8 of 100 slices shown (1 of 4)]
[im 1/100]
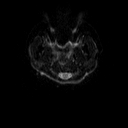
[im 15/100]
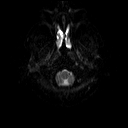
[im 29/100]
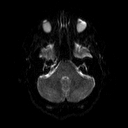
[im 43/100]
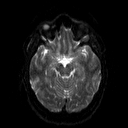
[im 57/100]
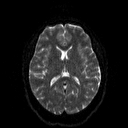
[im 71/100]
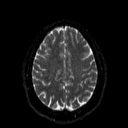
[im 85/100]
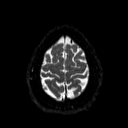
[im 100/100]
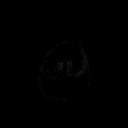

[Series 4: DWI · axial · 3.0mm · 1.80mm/px · z∈[-106,+40]mm · 4 of 50 slices shown (2 of 4)]
[im 1/50]
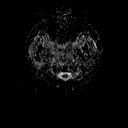
[im 17/50]
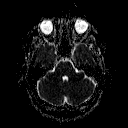
[im 33/50]
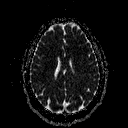
[im 50/50]
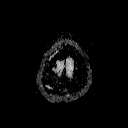

[Series 5: DWI · coronal · 5.0mm · 1.80mm/px · 6 of 76 slices shown (3 of 4)]
[im 1/76]
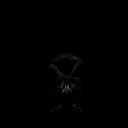
[im 16/76]
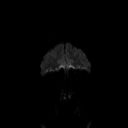
[im 31/76]
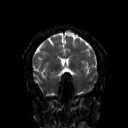
[im 46/76]
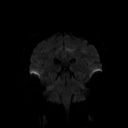
[im 61/76]
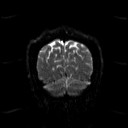
[im 76/76]
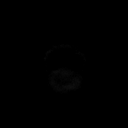

[Series 6: DWI · coronal · 5.0mm · 1.80mm/px · 3 of 38 slices shown (4 of 4)]
[im 1/38]
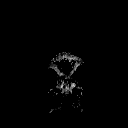
[im 19/38]
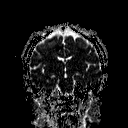
[im 38/38]
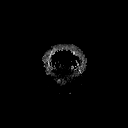

[Series 7: T2 · axial · 5.0mm · 0.51mm/px · z∈[-108,+37]mm · 2 of 22 slices shown (1 of 2)]
[im 1/22]
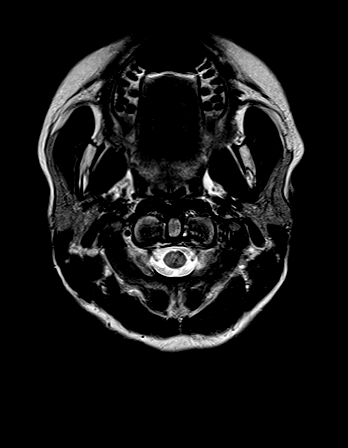
[im 22/22]
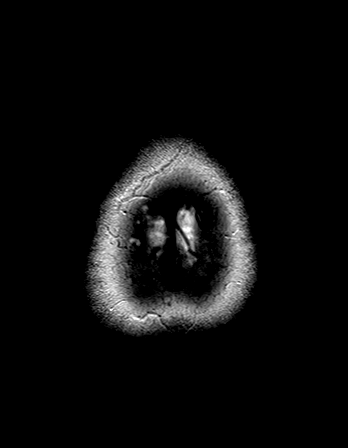

[Series 8: FLAIR · axial · 3.0mm · 0.45mm/px · z∈[-110,+37]mm · 3 of 33 slices shown]
[im 1/33]
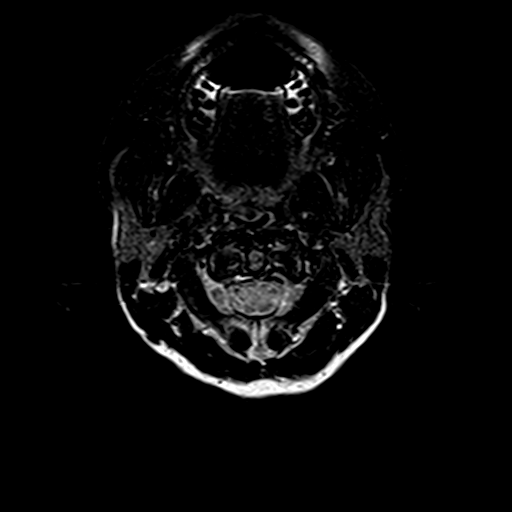
[im 17/33]
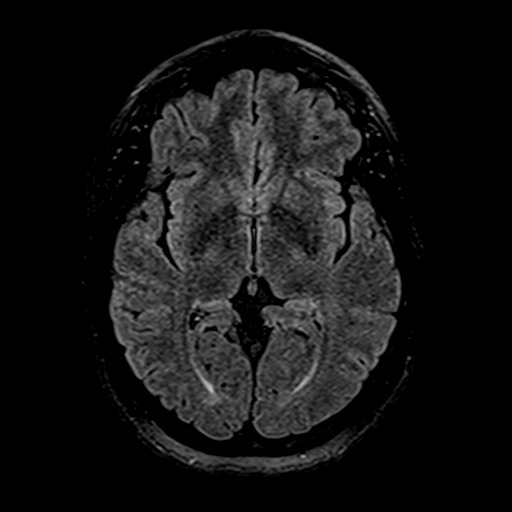
[im 33/33]
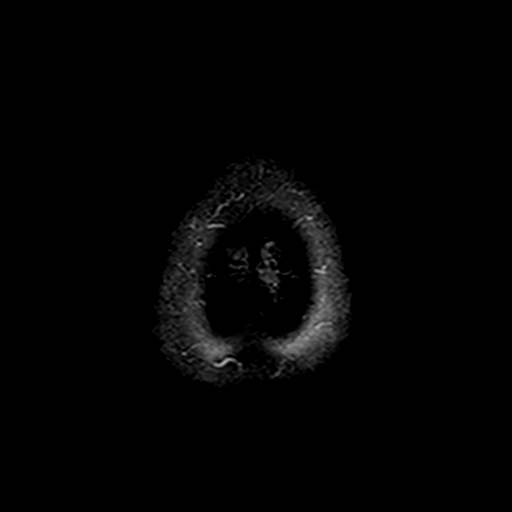

[Series 10: swi_images · axial · 4.0mm · 0.90mm/px · z∈[-113,+41]mm · 3 of 40 slices shown]
[im 1/40]
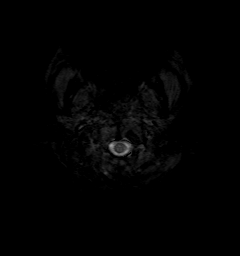
[im 20/40]
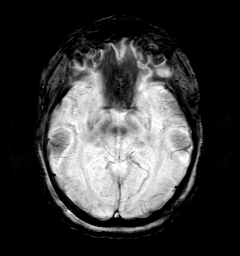
[im 40/40]
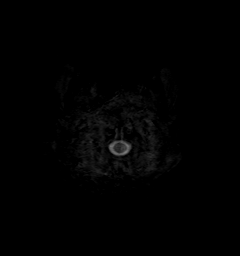

[Series 11: t1_mpr_tra · axial · 1.0mm · 0.71mm/px · z∈[-106,+36]mm · 11 of 144 slices shown]
[im 1/144]
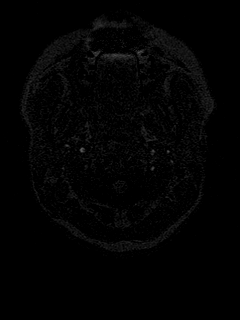
[im 15/144]
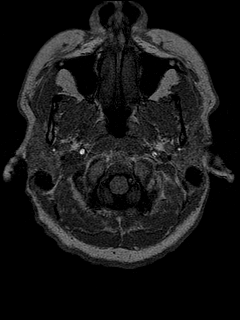
[im 29/144]
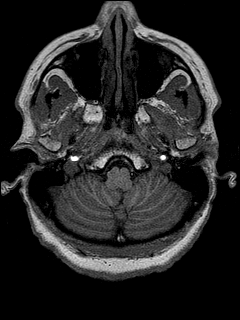
[im 43/144]
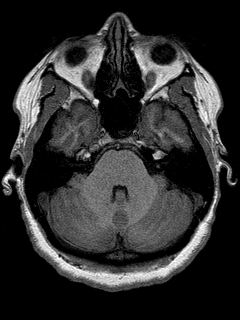
[im 58/144]
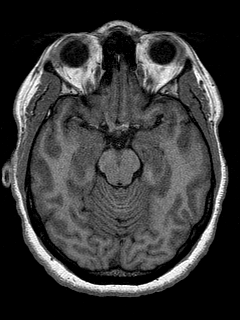
[im 72/144]
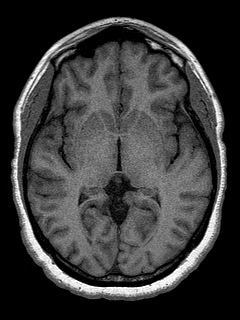
[im 86/144]
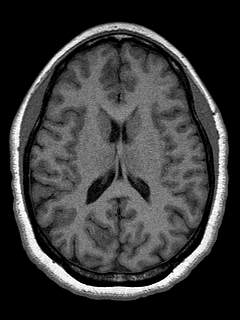
[im 101/144]
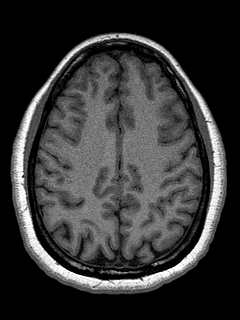
[im 115/144]
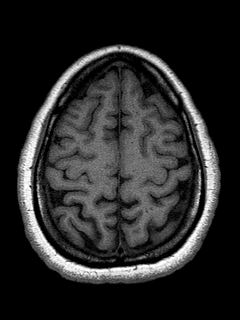
[im 129/144]
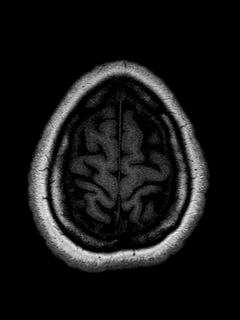
[im 144/144]
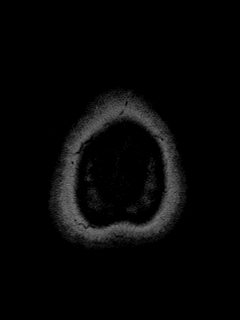

[Series 12: T2 · coronal · 5.0mm · 0.45mm/px · 2 of 28 slices shown (2 of 2)]
[im 1/28]
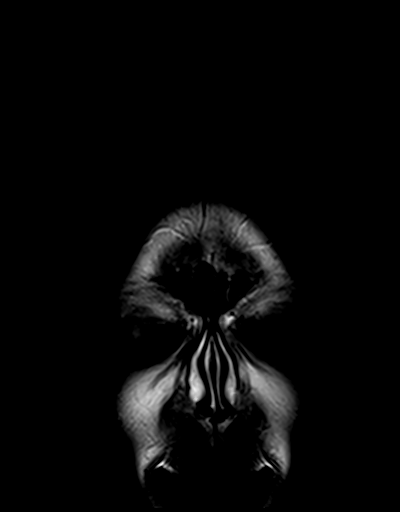
[im 28/28]
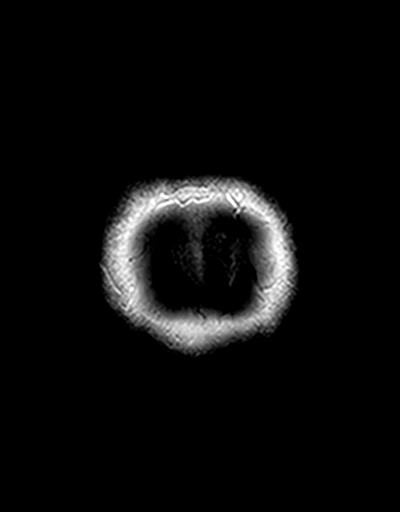

[Series 14: T1 · axial · 3.0mm · 0.35mm/px · 1 of 11 slices shown (2 of 3)]
[im 1/11]
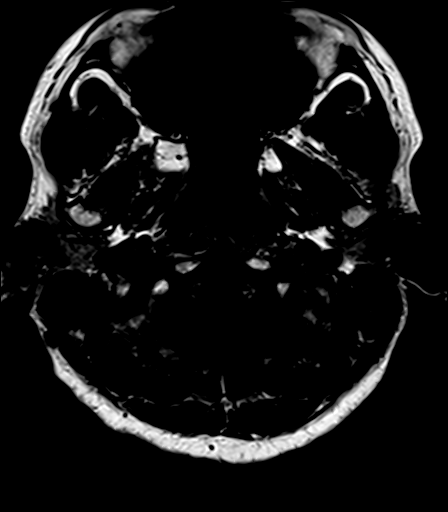

[Series 15: bSSFP · axial · 1.0mm · 0.28mm/px · z∈[-68,-33]mm · 3 of 36 slices shown]
[im 1/36]
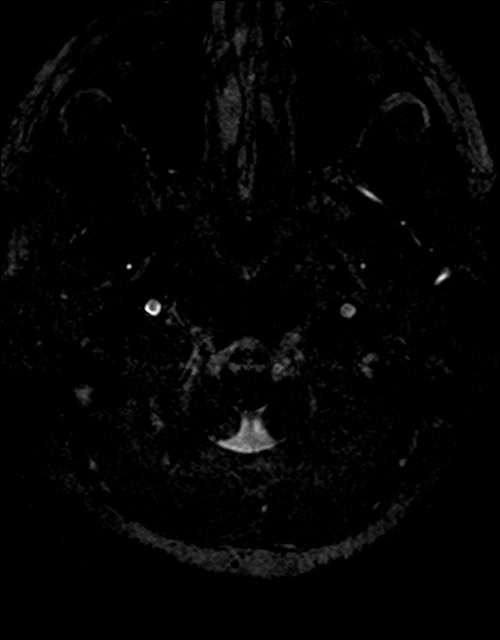
[im 18/36]
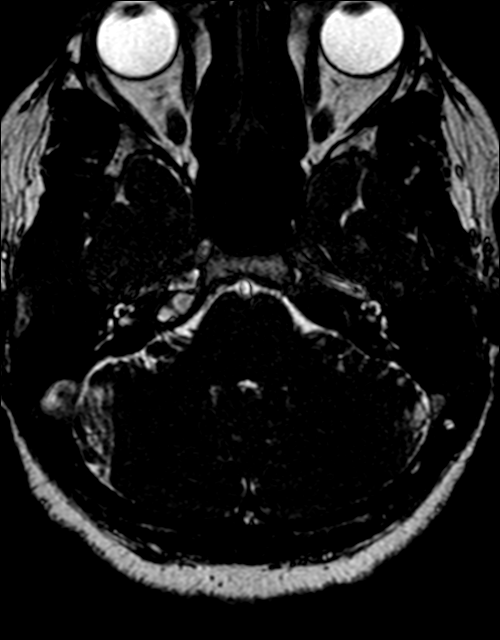
[im 36/36]
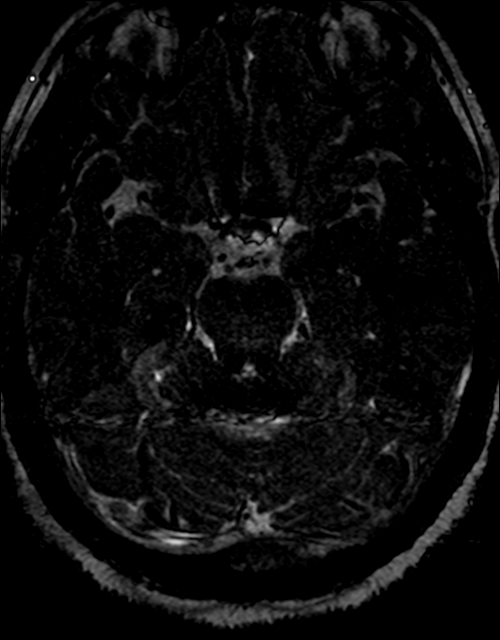

[Series 16: T1 · coronal · 3.0mm · 0.35mm/px · 1 of 12 slices shown (3 of 3)]
[im 1/12]
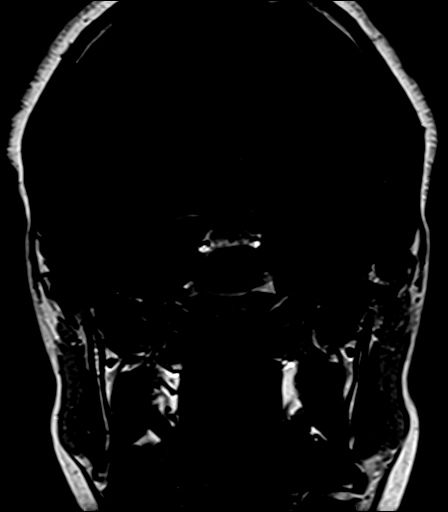

[48 of 48 positions shown; findings below may reference images not displayed]

FINDINGS: Brain: The brain is normally formed. No abnormality affects the
brainstem or cerebellum. Cerebral hemispheres are normal except for
a punctate focus of T2 and FLAIR signal in the left frontal white
matter, not significant. No cortical or large vessel territory
infarction. No mass lesion, hemorrhage, hydrocephalus or extra-axial
collection. CP angle regions are normal. No sign of schwannoma.

Vascular: Major vessels at the base of the brain show flow.

Skull and upper cervical spine: High T1 and T2 signal material at
the petrous apex on the right. Most likely diagnosis is trapped
fluid. Cholesterol granuloma is less likely. This can be associated
with chronic headache.

Sinuses/Orbits: Paranasal sinuses are clear. No fluid in the middle
ears or mastoid air cells. Orbits negative.

Other: None
IMPRESSION: Normal appearance of the brain itself.

Abnormal T1 and T2 signal material in the petrous apex on the right,
most consistent with trapped fluid. Cholesterol granuloma not
excluded but not favored. This can be associated with headache.

## 2023-08-05 ENCOUNTER — Encounter: Payer: Self-pay | Admitting: Adult Health

## 2023-08-05 ENCOUNTER — Telehealth: Payer: Commercial Managed Care - PPO | Admitting: Adult Health

## 2023-08-05 DIAGNOSIS — G43709 Chronic migraine without aura, not intractable, without status migrainosus: Secondary | ICD-10-CM

## 2023-08-05 MED ORDER — TIZANIDINE HCL 4 MG PO TABS
4.0000 mg | ORAL_TABLET | Freq: Four times a day (QID) | ORAL | 11 refills | Status: AC | PRN
Start: 2023-08-05 — End: ?

## 2023-08-05 MED ORDER — RIZATRIPTAN BENZOATE 10 MG PO TBDP: 10.0000 mg | ORAL_TABLET | ORAL | 11 refills | Status: AC | PRN

## 2023-08-05 NOTE — Progress Notes (Signed)
 [facility] [address] [place]. [place] [address] [phone]       OFFICE FOLLOW UP NOTE  [name] Date of Birth:  December 05, 1980 Medical Record Number:  [id number]   Primary neurologist: Dr. Onita Reason for visit: Migraine headaches  Virtual Visit via Video Note  I connected with Rachael Warren on 08/05/23 at  2:15 PM EDT by a video enabled telemedicine application and verified that I am speaking with the correct person using two identifiers.  Location: Patient: at work Provider: in office, [facility]   I discussed the limitations of evaluation and management by telemedicine and the availability of in person appointments. The patient expressed understanding and agreed to proceed.     SUBJECTIVE:   HPI:   Update 08/05/2023 JM: returns for yearly follow up visit via MyChart video visit.  Migraines remain overall stable.  Typically has about 1 migraine per month.  Occasionally experiences tension type headache, will use tizanidine  and ibuprofen  with benefit.  Continue use of rizatriptan  for migraine headaches with benefit.  Has not needed Zofran  as she has not experienced any recurrent dizziness.  She is satisfied with her current regimen.  No questions or concerns at this time.    History provided for reference purposes only Update 07/23/2022 JM: Returns for routine follow-up visit.  Reports she has been doing well.  Has experienced 1 migraine headache in the past 6 months.  Use of rizatriptan  and tizanidine  with benefit.  Has not needed Zofran  recently, has used previously when migraine associated with dizziness. She did come off birth control back in October which she believes has helped, husband underwent vasectomy.  No questions or concerns at this time.  Update 09/03/2021 JM: Patient returns for 43-month migraine follow-up.  Initiated nortriptyline  but complained of dizziness, difficulty focusing and feeling a little off.  MRI brain showed likely trapped  fluid at petrous apex on the right and recommended proceeding with MRI brain with and without contrast through [facility] but not yet completed.  Advised to hold nortriptyline  for at least 1 week, symptoms resolved and did not restart. Reports she has had 1 migraine headache about 2 weeks after prior visit, resolved after taking rizatriptan , Zofran  and tizanidine .  She is not currently on prophylactic therapy.  She also had her NuvaRing replaced about 3 weeks ago, her partner is scheduled to undergo vasectomy this week and she plans on removing NuvaRing once able.  No further concerns at this time   Consult visit 06/03/2021 Dr. Onita: [name] is a 43 year old female, seen in request by her primary care physician Dr. Chrystal Lamarr RAMAN, for evaluation of dizziness, headache, initial evaluation was on Jun 03, 2021 with Dr. Onita   I reviewed and summarized the referring note. PMHX. Chronic migraine.    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, tends to feel anxious, not sleeping well   In January 2023, she had episode of vertigo, lasting for 1 hour, nausea vomiting, but no headaches,  In February 2023, she had another episode of vertigo, mild headache   In March 2023, she had her worst headache, lateralized severe pounding, with vertigo, dizziness, lasting for 3 days, eventually went away,   She was seen by ENT, no abnormality found,  She also has minor  similar episode in April, and May, actually was at emergency room on Jun 02, 2021, persistent headaches, vertigo for 2 to 3 days, headache, located at occipital region, radiating towards the shoulder,  Laboratory showed normal CBC, CMP  She was given Valium , Reglan , Benadryl , make her feel lightheaded, sleepy, tired She was told to have  migraine variant, started on propanolol, just a few days ago, Imitrex 50 mg as needed         ROS:   14 system review of systems performed and negative with exception of those listed in HPI  PMH:  Past Medical History:  Diagnosis Date   Medical history non-contributory    Postpartum care following vaginal delivery (10/25) 10/29/2013   SVD (spontaneous vaginal delivery)    x 2   Vertigo     PSH:  Past Surgical History:  Procedure Laterality Date   DILATION AND CURETTAGE OF UTERUS  10/04/2017   in office for displaced iud    HYSTEROSCOPY WITH D & C N/A 11/29/2017   Procedure: DILATATION AND CURETTAGE /HYSTEROSCOPY;  Surgeon: Rutherford Gain, MD;  Location: [facility];  Service: Gynecology;  Laterality: N/A;   LAPAROSCOPY N/A 11/29/2017   Procedure: LAPAROSCOPY DIAGNOSTIC/Removal of IUD;  Surgeon: Rutherford Gain, MD;  Location: [facility];  Service: Gynecology;  Laterality: N/A;   TONSILLECTOMY     TONSILLECTOMY  age 55   WISDOM TOOTH EXTRACTION      Social History:  Social History   Socioeconomic History   Marital status: Significant Other    Spouse name: Not on file   Number of children: 2   Years of education: Doctorate   Highest education level: Not on file  Occupational History   Occupation: Dentist  Tobacco Use   Smoking status: Never   Smokeless tobacco: Never  Vaping Use   Vaping status: Never Used  Substance and Sexual Activity   Alcohol use: Yes    Comment: 1-2 drinks per month   Drug use: No   Sexual activity: Not on file    Comment: Mirena  Other Topics Concern   Not on file  Social History Narrative   Lives at home with significant other.   Right-handed.   No daily caffeine use.   Social Drivers of Corporate investment banker Strain: Not on file  Food Insecurity: Not on file  Transportation Needs: Not on file  Physical Activity: Not on file  Stress: Not on file  Social Connections: Not on file  Intimate Partner Violence: Not on file     Family History:  Family History  Problem Relation Age of Onset   Diabetes Paternal Grandmother    Heart disease Paternal Grandfather    Heart attack Paternal Grandfather    Brain cancer Maternal Aunt    Healthy Mother    Healthy Father     Medications:   Current Outpatient Medications on File Prior to Visit  Medication Sig Dispense Refill   ondansetron  (ZOFRAN ) 4 MG tablet Take 1 tablet (4 mg total) by mouth every 8 (eight) hours as needed for nausea or vomiting. (Patient not taking: Reported on 07/23/2022) 20 tablet 6   rizatriptan  (MAXALT -MLT) 10 MG disintegrating tablet Take 1 tablet (10 mg total) by mouth as needed. May repeat in 2 hours if needed 12 tablet 11   tiZANidine  (ZANAFLEX ) 4 MG tablet Take 1 tablet (4 mg total) by mouth every 6 (six) hours as needed for muscle spasms. 30 tablet 6   No current facility-administered medications on file  prior to visit.    Allergies:  No Known Allergies    OBJECTIVE:  Physical Exam General: well developed, well nourished, very pleasant middle-age African-American female, seated, in no evident distress  Neurologic Exam Mental Status: Awake and fully alert. Oriented to place and time. Recent and remote memory intact. Attention span, concentration and fund of knowledge appropriate. Mood and affect appropriate.     DIAGNOSTIC DATA   MRI BRAIN WO CONTRAST 06/08/2021 IMPRESSION: Normal appearance of the brain itself.   Abnormal T1 and T2 signal material in the petrous apex on the right, most consistent with trapped fluid. Cholesterol granuloma not excluded but not favored. This can be associated with headache.      ASSESSMENT/PLAN: CHANTELLE VERDI is a 43 y.o. year old female   Chronic migraine headaches Continue rizatriptan  and tizanidine  as needed for migraine headaches  -refills provided No indication for preventative therapy at this time                 MRI of the brain showed normal appearance of brain but abnormal  T1 and T2 signal material petrous apex on the right most consistent with trapped fluid  Previously tried/failed: Propranolol, nortriptyline  (side effects), sumatriptan, rizatriptan     Follow up in 1 year via MyChart video visit or call earlier if needed   CC:  PCP: Chrystal Lamarr RAMAN, MD    I personally spent a total of 15 minutes in the care of the patient today including preparing to see the patient, counseling and educating, placing orders, and documenting clinical information in the EHR.    Harlene Bogaert, AGNP-BC  [facility] [address] [place], [place] [address]  Phone [phone] Fax [phone] Note: This document was prepared with digital dictation and possible smart phrase technology. Any transcriptional errors that result from this process are unintentional.

## 2024-08-10 ENCOUNTER — Telehealth: Admitting: Adult Health
